# Patient Record
Sex: Female | Born: 1955 | Race: White | Hispanic: No | Marital: Married | Smoking: Current every day smoker
Health system: Southern US, Community
[De-identification: ages and names within clinical notes are randomized; demographics above are authoritative.]

## PROBLEM LIST (undated history)

## (undated) DIAGNOSIS — I10 Essential (primary) hypertension: Secondary | ICD-10-CM

## (undated) DIAGNOSIS — J449 Chronic obstructive pulmonary disease, unspecified: Secondary | ICD-10-CM

## (undated) DIAGNOSIS — E7800 Pure hypercholesterolemia, unspecified: Secondary | ICD-10-CM

## (undated) HISTORY — DX: Chronic obstructive pulmonary disease, unspecified: J44.9

---

## 2006-07-31 ENCOUNTER — Other Ambulatory Visit: Admission: RE | Admit: 2006-07-31 | Discharge: 2006-07-31 | Payer: Self-pay | Admitting: Gynecology

## 2006-08-21 ENCOUNTER — Encounter: Admission: RE | Admit: 2006-08-21 | Discharge: 2006-08-21 | Payer: Self-pay | Admitting: Gynecology

## 2014-06-05 ENCOUNTER — Encounter (HOSPITAL_COMMUNITY): Payer: Self-pay | Admitting: Emergency Medicine

## 2014-06-05 ENCOUNTER — Emergency Department (HOSPITAL_COMMUNITY)
Admission: EM | Admit: 2014-06-05 | Discharge: 2014-06-05 | Disposition: A | Discharge status: Home / Self Care | Payer: No Typology Code available for payment source | Source: Home / Self Care | Attending: Family Medicine | Admitting: Family Medicine

## 2014-06-05 DIAGNOSIS — J9801 Acute bronchospasm: Secondary | ICD-10-CM

## 2014-06-05 DIAGNOSIS — Z720 Tobacco use: Secondary | ICD-10-CM

## 2014-06-05 DIAGNOSIS — J069 Acute upper respiratory infection, unspecified: Secondary | ICD-10-CM

## 2014-06-05 MED ORDER — PREDNISONE 20 MG PO TABS: ORAL_TABLET | ORAL | Status: DC

## 2014-06-05 MED ORDER — TRIAMCINOLONE ACETONIDE 40 MG/ML IJ SUSP
40.0000 mg | Freq: Once | INTRAMUSCULAR | Status: AC
  Administered 2014-06-05: 40 mg via INTRAMUSCULAR

## 2014-06-05 MED ORDER — ALBUTEROL SULFATE (2.5 MG/3ML) 0.083% IN NEBU
INHALATION_SOLUTION | RESPIRATORY_TRACT | Status: AC
  Filled 2014-06-05: qty 3

## 2014-06-05 MED ORDER — ALBUTEROL SULFATE HFA 108 (90 BASE) MCG/ACT IN AERS: 2.0000 | INHALATION_SPRAY | RESPIRATORY_TRACT | Status: DC | PRN

## 2014-06-05 MED ORDER — TRIAMCINOLONE ACETONIDE 40 MG/ML IJ SUSP
INTRAMUSCULAR | Status: AC
  Filled 2014-06-05: qty 1

## 2014-06-05 MED ORDER — ALBUTEROL SULFATE (2.5 MG/3ML) 0.083% IN NEBU
2.5000 mg | INHALATION_SOLUTION | Freq: Once | RESPIRATORY_TRACT | Status: AC
  Administered 2014-06-05: 2.5 mg via RESPIRATORY_TRACT

## 2014-06-05 MED ORDER — IPRATROPIUM-ALBUTEROL 0.5-2.5 (3) MG/3ML IN SOLN
3.0000 mL | Freq: Once | RESPIRATORY_TRACT | Status: AC
  Administered 2014-06-05: 3 mL via RESPIRATORY_TRACT

## 2014-06-05 MED ORDER — IPRATROPIUM-ALBUTEROL 0.5-2.5 (3) MG/3ML IN SOLN
RESPIRATORY_TRACT | Status: AC
  Filled 2014-06-05: qty 3

## 2014-06-05 NOTE — Discharge Instructions (Signed)
Bronchospasm A bronchospasm is a spasm or tightening of the airways going into the lungs. During a bronchospasm breathing becomes more difficult because the airways get smaller. When this happens there can be coughing, a whistling sound when breathing (wheezing), and difficulty breathing. Bronchospasm is often associated with asthma, but not all patients who experience a bronchospasm have asthma. CAUSES  A bronchospasm is caused by inflammation or irritation of the airways. The inflammation or irritation may be triggered by:   Allergies (such as to animals, pollen, food, or mold). Allergens that cause bronchospasm may cause wheezing immediately after exposure or many hours later.   Infection. Viral infections are believed to be the most common cause of bronchospasm.   Exercise.   Irritants (such as pollution, cigarette smoke, strong odors, aerosol sprays, and paint fumes).   Weather changes. Winds increase molds and pollens in the air. Rain refreshes the air by washing irritants out. Cold air may cause inflammation.   Stress and emotional upset.  SIGNS AND SYMPTOMS   Wheezing.   Excessive nighttime coughing.   Frequent or severe coughing with a simple cold.   Chest tightness.   Shortness of breath.  DIAGNOSIS  Bronchospasm is usually diagnosed through a history and physical exam. Tests, such as chest X-rays, are sometimes done to look for other conditions. TREATMENT   Inhaled medicines can be given to open up your airways and help you breathe. The medicines can be given using either an inhaler or a nebulizer machine.  Corticosteroid medicines may be given for severe bronchospasm, usually when it is associated with asthma. HOME CARE INSTRUCTIONS   Always have a plan prepared for seeking medical care. Know when to call your health care provider and local emergency services (911 in the [place] Know where you can access local emergency care.  Only take medicines as  directed by your health care provider.  If you were prescribed an inhaler or nebulizer machine, ask your health care provider to explain how to use it correctly. Always use a spacer with your inhaler if you were given one.  It is necessary to remain calm during an attack. Try to relax and breathe more slowly.  Control your home environment in the following ways:   Change your heating and air conditioning filter at least once a month.   Limit your use of fireplaces and wood stoves.  Do not smoke and do not allow smoking in your home.   Avoid exposure to perfumes and fragrances.   Get rid of pests (such as roaches and mice) and their droppings.   Throw away plants if you see mold on them.   Keep your house clean and dust free.   Replace carpet with wood, tile, or vinyl flooring. Carpet can trap dander and dust.   Use allergy-proof pillows, mattress covers, and box spring covers.   Wash bed sheets and blankets every week in hot water and dry them in a dryer.   Use blankets that are made of polyester or cotton.   Wash hands frequently. SEEK MEDICAL CARE IF:   You have muscle aches.   You have chest pain.   The sputum changes from clear or white to yellow, green, gray, or bloody.   The sputum you cough up gets thicker.   There are problems that may be related to the medicine you are given, such as a rash, itching, swelling, or trouble breathing.  SEEK IMMEDIATE MEDICAL CARE IF:   You have worsening wheezing and coughing even  after taking your prescribed medicines.   You have increased difficulty breathing.   You develop severe chest pain. MAKE SURE YOU:   Understand these instructions.  Will watch your condition.  Will get help right away if you are not doing well or get worse. Document Released: 07/14/2003 Document Revised: 07/16/2013 Document Reviewed: 12/31/2012 [facility] Patient Information 2015 [place], [place]. This information is not  intended to replace advice given to you by your health care provider. Make sure you discuss any questions you have with your health care provider.  Cough, Adult  A cough is a reflex that helps clear your throat and airways. It can help heal the body or may be a reaction to an irritated airway. A cough may only last 2 or 3 weeks (acute) or may last more than 8 weeks (chronic).  CAUSES Acute cough:  Viral or bacterial infections. Chronic cough:  Infections.  Allergies.  Asthma.  Post-nasal drip.  Smoking.  Heartburn or acid reflux.  Some medicines.  Chronic lung problems (COPD).  Cancer. SYMPTOMS   Cough.  Fever.  Chest pain.  Increased breathing rate.  High-pitched whistling sound when breathing (wheezing).  Colored mucus that you cough up (sputum). TREATMENT   A bacterial cough may be treated with antibiotic medicine.  A viral cough must run its course and will not respond to antibiotics.  Your caregiver may recommend other treatments if you have a chronic cough. HOME CARE INSTRUCTIONS   Only take over-the-counter or prescription medicines for pain, discomfort, or fever as directed by your caregiver. Use cough suppressants only as directed by your caregiver.  Use a cold steam vaporizer or humidifier in your bedroom or home to help loosen secretions.  Sleep in a semi-upright position if your cough is worse at night.  Rest as needed.  Stop smoking if you smoke. SEEK IMMEDIATE MEDICAL CARE IF:   You have pus in your sputum.  Your cough starts to worsen.  You cannot control your cough with suppressants and are losing sleep.  You begin coughing up blood.  You have difficulty breathing.  You develop pain which is getting worse or is uncontrolled with medicine.  You have a fever. MAKE SURE YOU:   Understand these instructions.  Will watch your condition.  Will get help right away if you are not doing well or get worse. Document Released:  01/07/2011 Document Revised: 10/03/2011 Document Reviewed: 01/07/2011 [facility] Patient Information 2015 [place], [place]. This information is not intended to replace advice given to you by your health care provider. Make sure you discuss any questions you have with your health care provider.  How to Use an Inhaler Using your inhaler correctly is very important. Good technique will make sure that the medicine reaches your lungs.  HOW TO USE AN INHALER:  Take the cap off the inhaler.  If this is the first time using your inhaler, you need to prime it. Shake the inhaler for 5 seconds. Release four puffs into the air, away from your face. Ask your doctor for help if you have questions.  Shake the inhaler for 5 seconds.  Turn the inhaler so the bottle is above the mouthpiece.  Put your pointer finger on top of the bottle. Your thumb holds the bottom of the inhaler.  Open your mouth.  Either hold the inhaler away from your mouth (the width of 2 fingers) or place your lips tightly around the mouthpiece. Ask your doctor which way to use your inhaler.  Breathe out as  much air as possible.  Breathe in and push down on the bottle 1 time to release the medicine. You will feel the medicine go in your mouth and throat.  Continue to take a deep breath in very slowly. Try to fill your lungs.  After you have breathed in completely, hold your breath for 10 seconds. This will help the medicine to settle in your lungs. If you cannot hold your breath for 10 seconds, hold it for as long as you can before you breathe out.  Breathe out slowly, through pursed lips. Whistling is an example of pursed lips.  If your doctor has told you to take more than 1 puff, wait at least 15-30 seconds between puffs. This will help you get the best results from your medicine. Do not use the inhaler more than your doctor tells you to.  Put the cap back on the inhaler.  Follow the directions from your doctor or from the  inhaler package about cleaning the inhaler. If you use more than one inhaler, ask your doctor which inhalers to use and what order to use them in. Ask your doctor to help you figure out when you will need to refill your inhaler.  If you use a steroid inhaler, always rinse your mouth with water after your last puff, gargle and spit out the water. Do not swallow the water. GET HELP IF:  The inhaler medicine only partially helps to stop wheezing or shortness of breath.  You are having trouble using your inhaler.  You have some increase in thick spit (phlegm). GET HELP RIGHT AWAY IF:  The inhaler medicine does not help your wheezing or shortness of breath or you have tightness in your chest.  You have dizziness, headaches, or fast heart rate.  You have chills, fever, or night sweats.  You have a large increase of thick spit, or your thick spit is bloody. MAKE SURE YOU:   Understand these instructions.  Will watch your condition.  Will get help right away if you are not doing well or get worse. Document Released: 04/19/2008 Document Revised: 05/01/2013 Document Reviewed: 02/07/2013 [facility] Patient Information 2015 [place], [place]. This information is not intended to replace advice given to you by your health care provider. Make sure you discuss any questions you have with your health care provider.  Smoking Cessation Quitting smoking is important to your health and has many advantages. However, it is not always easy to quit since nicotine is a very addictive drug. Oftentimes, people try 3 times or more before being able to quit. This document explains the best ways for you to prepare to quit smoking. Quitting takes hard work and a lot of effort, but you can do it. ADVANTAGES OF QUITTING SMOKING  You will live longer, feel better, and live better.  Your body will feel the impact of quitting smoking almost immediately.  Within 20 minutes, blood pressure decreases. Your pulse returns to  its normal level.  After 8 hours, carbon monoxide levels in the blood return to normal. Your oxygen level increases.  After 24 hours, the chance of having a heart attack starts to decrease. Your breath, hair, and body stop smelling like smoke.  After 48 hours, damaged nerve endings begin to recover. Your sense of taste and smell improve.  After 72 hours, the body is virtually free of nicotine. Your bronchial tubes relax and breathing becomes easier.  After 2 to 12 weeks, lungs can hold more air. Exercise becomes easier and circulation  improves.  The risk of having a heart attack, stroke, cancer, or lung disease is greatly reduced.  After 1 year, the risk of coronary heart disease is cut in half.  After 5 years, the risk of stroke falls to the same as a nonsmoker.  After 10 years, the risk of lung cancer is cut in half and the risk of other cancers decreases significantly.  After 15 years, the risk of coronary heart disease drops, usually to the level of a nonsmoker.  If you are pregnant, quitting smoking will improve your chances of having a healthy baby.  The people you live with, especially any children, will be healthier.  You will have extra money to spend on things other than cigarettes. QUESTIONS TO THINK ABOUT BEFORE ATTEMPTING TO QUIT You may want to talk about your answers with your health care provider.  Why do you want to quit?  If you tried to quit in the past, what helped and what did not?  What will be the most difficult situations for you after you quit? How will you plan to handle them?  Who can help you through the tough times? Your family? Friends? A health care provider?  What pleasures do you get from smoking? What ways can you still get pleasure if you quit? Here are some questions to ask your health care provider:  How can you help me to be successful at quitting?  What medicine do you think would be best for me and how should I take it?  What should  I do if I need more help?  What is smoking withdrawal like? How can I get information on withdrawal? GET READY  Set a quit date.  Change your environment by getting rid of all cigarettes, ashtrays, matches, and lighters in your home, car, or work. Do not let people smoke in your home.  Review your past attempts to quit. Think about what worked and what did not. GET SUPPORT AND ENCOURAGEMENT You have a better chance of being successful if you have help. You can get support in many ways.  Tell your family, friends, and coworkers that you are going to quit and need their support. Ask them not to smoke around you.  Get individual, group, or telephone counseling and support. Programs are available at [facility]. Call your local health department for information about programs in your area.  Spiritual beliefs and practices may help some smokers quit.  Download a "quit meter" on your computer to keep track of quit statistics, such as how long you have gone without smoking, cigarettes not smoked, and money saved.  Get a self-help book about quitting smoking and staying off tobacco. LEARN NEW SKILLS AND BEHAVIORS  Distract yourself from urges to smoke. Talk to someone, go for a walk, or occupy your time with a task.  Change your normal routine. Take a different route to work. Drink tea instead of coffee. Eat breakfast in a different place.  Reduce your stress. Take a hot bath, exercise, or read a book.  Plan something enjoyable to do every day. Reward yourself for not smoking.  Explore interactive web-based programs that specialize in helping you quit. GET MEDICINE AND USE IT CORRECTLY Medicines can help you stop smoking and decrease the urge to smoke. Combining medicine with the above behavioral methods and support can greatly increase your chances of successfully quitting smoking.  Nicotine replacement therapy helps deliver nicotine to your body without the  negative effects and  risks of smoking. Nicotine replacement therapy includes nicotine gum, lozenges, inhalers, nasal sprays, and skin patches. Some may be available over-the-counter and others require a prescription.  Antidepressant medicine helps people abstain from smoking, but how this works is unknown. This medicine is available by prescription.  Nicotinic receptor partial agonist medicine simulates the effect of nicotine in your brain. This medicine is available by prescription. Ask your health care provider for advice about which medicines to use and how to use them based on your health history. Your health care provider will tell you what side effects to look out for if you choose to be on a medicine or therapy. Carefully read the information on the package. Do not use any other product containing nicotine while using a nicotine replacement product.  RELAPSE OR DIFFICULT SITUATIONS Most relapses occur within the first 3 months after quitting. Do not be discouraged if you start smoking again. Remember, most people try several times before finally quitting. You may have symptoms of withdrawal because your body is used to nicotine. You may crave cigarettes, be irritable, feel very hungry, cough often, get headaches, or have difficulty concentrating. The withdrawal symptoms are only temporary. They are strongest when you first quit, but they will go away within 10-14 days. To reduce the chances of relapse, try to:  Avoid drinking alcohol. Drinking lowers your chances of successfully quitting.  Reduce the amount of caffeine you consume. Once you quit smoking, the amount of caffeine in your body increases and can give you symptoms, such as a rapid heartbeat, sweating, and anxiety.  Avoid smokers because they can make you want to smoke.  Do not let weight gain distract you. Many smokers will gain weight when they quit, usually less than 10 pounds. Eat a healthy diet and stay active. You can always  lose the weight gained after you quit.  Find ways to improve your mood other than smoking. FOR MORE INFORMATION  www.smokefree.gov  Document Released: 07/05/2001 Document Revised: 11/25/2013 Document Reviewed: 10/20/2011 [facility] Patient Information 2015 [place]. This information is not intended to replace advice given to you by your health care provider. Make sure you discuss any questions you have with your health care provider.  Upper Respiratory Infection, Adult An upper respiratory infection (URI) is also known as the common cold. It is often caused by a type of germ (virus). Colds are easily spread (contagious). You can pass it to others by kissing, coughing, sneezing, or drinking out of the same glass. Usually, you get better in 1 or 2 weeks.  HOME CARE   Only take medicine as told by your doctor.  Use a warm mist humidifier or breathe in steam from a hot shower.  Drink enough water and fluids to keep your pee (urine) clear or pale yellow.  Get plenty of rest.  Return to work when your temperature is back to normal or as told by your doctor. You may use a face mask and wash your hands to stop your cold from spreading. GET HELP RIGHT AWAY IF:   After the first few days, you feel you are getting worse.  You have questions about your medicine.  You have chills, shortness of breath, or brown or red spit (mucus).  You have yellow or brown snot (nasal discharge) or pain in the face, especially when you bend forward.  You have a fever, puffy (swollen) neck, pain when you swallow, or white spots in the back of your throat.  You have a bad  headache, ear pain, sinus pain, or chest pain.  You have a high-pitched whistling sound when you breathe in and out (wheezing).  You have a lasting cough or cough up blood.  You have sore muscles or a stiff neck. MAKE SURE YOU:   Understand these instructions.  Will watch your condition.  Will get help right away if you are not  doing well or get worse. Document Released: 12/28/2007 Document Revised: 10/03/2011 Document Reviewed: 10/16/2013 [facility] Patient Information 2015 [place], [place]. This information is not intended to replace advice given to you by your health care provider. Make sure you discuss any questions you have with your health care provider.

## 2014-06-05 NOTE — ED Notes (Signed)
Cough and cold for 2 weeks.  Has tried mucinex and antihistamine .  Initially sore throat, then head congestion, then chest congestion.  Reports blowing green and coughing up green in am, sputum clears in color throughout the day.  Reports cough is wearing her out, very tired

## 2014-06-05 NOTE — ED Provider Notes (Signed)
CSN: [id number]     Arrival date & time 06/05/14  0801 History   First MD Initiated Contact with Patient 06/05/14 [phone]     Chief Complaint  Patient presents with  . Cough  . URI   (Consider location/radiation/quality/duration/timing/severity/associated sxs/prior Treatment) HPI Comments: COugh for 1 week. Smokes 1/2PPD. PND. Denies fevers.   History reviewed. No pertinent past medical history. History reviewed. No pertinent past surgical history. No family history on file. History  Substance Use Topics  . Smoking status: Current Every Day Smoker  . Smokeless tobacco: Not on file  . Alcohol Use: No   OB History    No data available     Review of Systems  Constitutional: Negative for fever, diaphoresis, activity change and fatigue.  HENT: Positive for congestion and postnasal drip.   Eyes: Negative.   Respiratory: Positive for cough and shortness of breath.   Cardiovascular: Negative.   Gastrointestinal: Negative.   Neurological: Negative.     Allergies  Review of patient's allergies indicates no known allergies.  Home Medications   Prior to Admission medications   Medication Sig Start Date End Date Taking? Authorizing Provider  guaiFENesin (MUCINEX) 600 MG 12 hr tablet Take by mouth 2 (two) times daily.   Yes Historical Provider, MD  albuterol (PROVENTIL HFA;VENTOLIN HFA) 108 (90 BASE) MCG/ACT inhaler Inhale 2 puffs into the lungs every 4 (four) hours as needed for wheezing or shortness of breath. 06/05/14   Hayden Rasmussenavid Abdulhadi Stopa, NP  predniSONE (DELTASONE) 20 MG tablet Take 3 tabs po on first day, 2 tabs second day, 2 tabs third day, 1 tab fourth day, 1 tab 5th day. Take with food. Start 06/06/14. 06/05/14   Hayden Rasmussenavid Exzavier Ruderman, NP   BP 139/80 mmHg  Pulse 73  Temp(Src) 98.2 F (36.8 C) (Oral)  Resp 18  SpO2 97% Physical Exam  Constitutional: She is oriented to person, place, and time. She appears well-developed and well-nourished. No distress.  HENT:  Mouth/Throat: Oropharynx is  clear and moist. No oropharyngeal exudate.  OP with moderate clear PND  Eyes: Conjunctivae and EOM are normal.  Neck: Normal range of motion. Neck supple.  Cardiovascular: Normal rate, regular rhythm and normal heart sounds.   Pulmonary/Chest: Effort normal. No respiratory distress. She has wheezes. She has no rales.  Prolonged expiratory phase  Lymphadenopathy:    She has no cervical adenopathy.  Neurological: She is alert and oriented to person, place, and time.  Skin: Skin is warm and dry.  Psychiatric: She has a normal mood and affect.  Nursing note and vitals reviewed.   ED Course  Procedures (including critical care time) Labs Review Labs Reviewed - No data to display  Imaging Review No results found.   MDM   1. Bronchospasm   2. URI (upper respiratory infection)   3. Tobacco abuse    \  Post Duoneb 5/2.5: Improved air movement, decrease wheeze. Kenalog 40 mg IM Prednisone taper Albuterol hfa Continue Guiaf/DM F/u with a PCP for physical and lung evaluation.   Hayden Rasmussenavid Sallee Hogrefe, NP 06/05/14 [phone]

## 2016-01-18 ENCOUNTER — Other Ambulatory Visit: Payer: Self-pay | Admitting: Family

## 2016-01-18 ENCOUNTER — Other Ambulatory Visit: Payer: Self-pay

## 2016-01-18 ENCOUNTER — Other Ambulatory Visit (HOSPITAL_COMMUNITY)
Admission: RE | Admit: 2016-01-18 | Discharge: 2016-01-18 | Disposition: A | Discharge status: Home / Self Care | Payer: Managed Care, Other (non HMO) | Source: Ambulatory Visit | Attending: Family Medicine | Admitting: Family Medicine

## 2016-01-18 DIAGNOSIS — Z01419 Encounter for gynecological examination (general) (routine) without abnormal findings: Secondary | ICD-10-CM | POA: Insufficient documentation

## 2016-01-18 DIAGNOSIS — R5381 Other malaise: Secondary | ICD-10-CM

## 2016-01-19 ENCOUNTER — Other Ambulatory Visit: Payer: Self-pay | Admitting: Family

## 2016-01-19 DIAGNOSIS — Z1231 Encounter for screening mammogram for malignant neoplasm of breast: Secondary | ICD-10-CM

## 2016-01-19 LAB — CYTOLOGY - PAP

## 2016-07-22 ENCOUNTER — Other Ambulatory Visit: Payer: Self-pay | Admitting: Family

## 2016-07-22 DIAGNOSIS — E2839 Other primary ovarian failure: Secondary | ICD-10-CM

## 2016-07-26 ENCOUNTER — Other Ambulatory Visit: Payer: Self-pay | Admitting: Family

## 2016-07-26 DIAGNOSIS — Z1239 Encounter for other screening for malignant neoplasm of breast: Secondary | ICD-10-CM

## 2017-04-16 ENCOUNTER — Emergency Department (HOSPITAL_COMMUNITY)
Admission: EM | Admit: 2017-04-16 | Discharge: 2017-04-16 | Disposition: A | Discharge status: Home / Self Care | Payer: No Typology Code available for payment source | Attending: Emergency Medicine | Admitting: Emergency Medicine

## 2017-04-16 ENCOUNTER — Encounter (HOSPITAL_COMMUNITY): Payer: Self-pay | Admitting: Emergency Medicine

## 2017-04-16 ENCOUNTER — Emergency Department (HOSPITAL_COMMUNITY): Payer: No Typology Code available for payment source

## 2017-04-16 DIAGNOSIS — F17210 Nicotine dependence, cigarettes, uncomplicated: Secondary | ICD-10-CM | POA: Insufficient documentation

## 2017-04-16 DIAGNOSIS — K529 Noninfective gastroenteritis and colitis, unspecified: Secondary | ICD-10-CM | POA: Insufficient documentation

## 2017-04-16 LAB — COMPREHENSIVE METABOLIC PANEL
ALT: 29 U/L (ref 14–54)
AST: 30 U/L (ref 15–41)
Albumin: 3.4 g/dL — ABNORMAL LOW (ref 3.5–5.0)
Alkaline Phosphatase: 154 U/L — ABNORMAL HIGH (ref 38–126)
Anion gap: 9 (ref 5–15)
BUN: 12 mg/dL (ref 6–20)
CO2: 26 mmol/L (ref 22–32)
Calcium: 9.1 mg/dL (ref 8.9–10.3)
Chloride: 100 mmol/L — ABNORMAL LOW (ref 101–111)
Creatinine, Ser: 1.1 mg/dL — ABNORMAL HIGH (ref 0.44–1.00)
GFR calc Af Amer: >60 mL/min (ref >60)
GFR calc non Af Amer: 54 mL/min — ABNORMAL LOW (ref >60)
Glucose, Bld: 143 mg/dL — ABNORMAL HIGH (ref 65–99)
Potassium: 3.3 mmol/L — ABNORMAL LOW (ref 3.5–5.1)
Sodium: 135 mmol/L (ref 135–145)
Total Bilirubin: 0.6 mg/dL (ref 0.3–1.2)
Total Protein: 7.4 g/dL (ref 6.5–8.1)

## 2017-04-16 LAB — CBC
HCT: 38.3 % (ref 36.0–46.0)
Hemoglobin: 13 g/dL (ref 12.0–15.0)
MCH: 32.8 pg (ref 26.0–34.0)
MCHC: 33.9 g/dL (ref 30.0–36.0)
MCV: 96.7 fL (ref 78.0–100.0)
Platelets: 274 10*3/uL (ref 150–400)
RBC: 3.96 MIL/uL (ref 3.87–5.11)
RDW: 12.8 % (ref 11.5–15.5)
WBC: 11.1 10*3/uL — ABNORMAL HIGH (ref 4.0–10.5)

## 2017-04-16 LAB — URINALYSIS, ROUTINE W REFLEX MICROSCOPIC
Bilirubin Urine: NEGATIVE
Glucose, UA: NEGATIVE mg/dL
Ketones, ur: 20 mg/dL — AB
Leukocytes, UA: NEGATIVE
Nitrite: NEGATIVE
Protein, ur: 100 mg/dL — AB
Specific Gravity, Urine: 1.027 (ref 1.005–1.030)
pH: 5 (ref 5.0–8.0)

## 2017-04-16 LAB — I-STAT BETA HCG BLOOD, ED (MC, WL, AP ONLY): HCG, QUANTITATIVE: 5.3 m[IU]/mL — AB (ref <5)

## 2017-04-16 LAB — LIPASE, BLOOD: Lipase: 21 U/L (ref 11–51)

## 2017-04-16 MED ORDER — IOPAMIDOL (ISOVUE-300) INJECTION 61%
INTRAVENOUS | Status: AC
  Administered 2017-04-16: 100 mL
  Filled 2017-04-16: qty 100

## 2017-04-16 MED ORDER — HYDROMORPHONE HCL 1 MG/ML IJ SOLN
1.0000 mg | Freq: Once | INTRAMUSCULAR | Status: AC
  Administered 2017-04-16: 1 mg via INTRAVENOUS
  Filled 2017-04-16: qty 1

## 2017-04-16 MED ORDER — ONDANSETRON 4 MG PO TBDP
4.0000 mg | ORAL_TABLET | Freq: Once | ORAL | Status: AC | PRN
  Administered 2017-04-16: 4 mg via ORAL

## 2017-04-16 MED ORDER — SODIUM CHLORIDE 0.9 % IV BOLUS (SEPSIS)
1000.0000 mL | Freq: Once | INTRAVENOUS | Status: AC
  Administered 2017-04-16: 1000 mL via INTRAVENOUS

## 2017-04-16 MED ORDER — KETOROLAC TROMETHAMINE 15 MG/ML IJ SOLN
15.0000 mg | Freq: Once | INTRAMUSCULAR | Status: AC
  Administered 2017-04-16: 15 mg via INTRAVENOUS
  Filled 2017-04-16: qty 1

## 2017-04-16 MED ORDER — ONDANSETRON 4 MG PO TBDP
ORAL_TABLET | ORAL | Status: AC
  Filled 2017-04-16: qty 1

## 2017-04-16 MED ORDER — HYDROCODONE-ACETAMINOPHEN 5-325 MG PO TABS: 1.0000 | ORAL_TABLET | ORAL | 0 refills | Status: DC | PRN

## 2017-04-16 MED ORDER — IBUPROFEN 600 MG PO TABS: 600.0000 mg | ORAL_TABLET | Freq: Four times a day (QID) | ORAL | 0 refills | Status: DC | PRN

## 2017-04-16 NOTE — ED Notes (Signed)
Pt is resting comfortably-denies abd pain, but reports mild nausea.

## 2017-04-16 NOTE — ED Notes (Signed)
Unable to provide enough urine for POC urinalysis, iSTAT hcg order placed as an alternative.

## 2017-04-16 NOTE — ED Triage Notes (Signed)
Pt reports fever TMAX 102.5 since Wednesday, states she was already seen by PCP and told she has a virus after negative blood and urine exams. States loose stools that have since resolved. Now reporting vomiting x3 hours. States lower abdominal pain described as "my insides will fall out." Denies medical hx.

## 2017-04-16 NOTE — ED Notes (Signed)
Patient transported to CT 

## 2017-04-21 NOTE — ED Provider Notes (Signed)
[facility]-EM[facility]ENCY DEPT Provider Note   CSN: [id number] Arrival date & time: 04/16/17  4098     History   Chief Complaint Chief Complaint  Patient presents with  . Fever  . Emesis    HPI Anne Montes is a 61 y.o. female.  HPI   61yf with abdominal pain n/v and loose stool. Onset several days ago. Persistent. Pain waxes and wanes. NBNB vomiting. No blood in stool. Subjective fever. No sick contacts. No recent abx.   History reviewed. No pertinent past medical history.  There are no active problems to display for this patient.   History reviewed. No pertinent surgical history.  OB History    No data available       Home Medications    Prior to Admission medications   Medication Sig Start Date End Date Taking? Authorizing Provider  albuterol (PROVENTIL HFA;VENTOLIN HFA) 108 (90 BASE) MCG/ACT inhaler Inhale 2 puffs into the lungs every 4 (four) hours as needed for wheezing or shortness of breath. 06/05/14  Yes Mabe, Onalee Hua, NP  guaiFENesin (MUCINEX) 600 MG 12 hr tablet Take 1,200 mg by mouth 2 (two) times daily as needed for cough.    Yes [provider]  HYDROcodone-acetaminophen (NORCO/VICODIN) 5-325 MG tablet Take 1-2 tablets by mouth every 4 (four) hours as needed. 04/16/17   Raeford Razor, MD  ibuprofen (ADVIL,MOTRIN) 600 MG tablet Take 1 tablet (600 mg total) by mouth every 6 (six) hours as needed. 04/16/17   Raeford Razor, MD    Family History No family history on file.  Social History Social History  Substance Use Topics  . Smoking status: Current Every Day Smoker    Types: Cigarettes  . Smokeless tobacco: Never Used  . Alcohol use Yes     Allergies   Patient has no known allergies.   Review of Systems Review of Systems  All systems reviewed and negative, other than as noted in HPI.  Physical Exam Updated Vital Signs BP 131/84 (BP Location: Right Arm)   Pulse 71   Temp 98.9 F (37.2 C) (Oral)   Resp 17   Ht  (1.575 m)   Wt  58.5 kg (129 lb)   SpO2 95%   BMI 23.59 kg/m   Physical Exam  Constitutional: She appears well-developed and well-nourished. No distress.  HENT:  Head: Normocephalic and atraumatic.  Eyes: Conjunctivae are normal. Right eye exhibits no discharge. Left eye exhibits no discharge.  Neck: Neck supple.  Cardiovascular: Normal rate, regular rhythm and normal heart sounds.  Exam reveals no gallop and no friction rub.   No murmur heard. Pulmonary/Chest: Effort normal and breath sounds normal. No respiratory distress.  Abdominal: Soft. She exhibits no distension. There is tenderness.  Diffuse tenderness w/o rebound or guarding  Musculoskeletal: She exhibits no edema or tenderness.  Neurological: She is alert.  Skin: Skin is warm and dry.  Psychiatric: She has a normal mood and affect. Her behavior is normal. Thought content normal.  Nursing note and vitals reviewed.    ED Treatments / Results  Labs (all labs ordered are listed, but only abnormal results are displayed) Labs Reviewed  COMPREHENSIVE METABOLIC PANEL - Abnormal; Notable for the following:       Result Value   Potassium 3.3 (*)    Chloride 100 (*)    Glucose, Bld 143 (*)    Creatinine, Ser 1.10 (*)    Albumin 3.4 (*)    Alkaline Phosphatase 154 (*)    GFR calc non Af  Amer 54 (*)    All other components within normal limits  CBC - Abnormal; Notable for the following:    WBC 11.1 (*)    All other components within normal limits  URINALYSIS, ROUTINE W REFLEX MICROSCOPIC - Abnormal; Notable for the following:    APPearance CLOUDY (*)    Hgb urine dipstick SMALL (*)    Ketones, ur 20 (*)    Protein, ur 100 (*)    Bacteria, UA RARE (*)    Squamous Epithelial / LPF 6-30 (*)    All other components within normal limits  I-STAT BETA HCG BLOOD, ED ([facility], [facility], [facility] ONLY) - Abnormal; Notable for the following:    I-stat hCG, quantitative 5.3 (*)    All other components within normal limits  LIPASE, BLOOD  I-STAT BETA HCG BLOOD,  ED ([facility], [facility], [facility] ONLY)    EKG  EKG Interpretation None       Radiology No results found.   Ct Abdomen Pelvis W Contrast  Result Date: 04/16/2017 CLINICAL DATA:  Left lower quadrant pain with fevers EXAM: CT ABDOMEN AND PELVIS WITH CONTRAST TECHNIQUE: Multidetector CT imaging of the abdomen and pelvis was performed using the standard protocol following bolus administration of intravenous contrast. CONTRAST:  100 mL ISOVUE-300 IOPAMIDOL (ISOVUE-300) INJECTION 61% COMPARISON:  None. FINDINGS: Lower chest: Mild emphysematous changes are noted. No focal infiltrate or sizable effusion is seen. Hepatobiliary: No focal liver abnormality is seen. No gallstones, gallbladder wall thickening, or biliary dilatation. Pancreas: Unremarkable. No pancreatic ductal dilatation or surrounding inflammatory changes. Spleen: Normal in size without focal abnormality. Adrenals/Urinary Tract: Kidneys demonstrate no obstructive changes. The adrenal glands are within normal limits. Right renal cyst is seen. The bladder is decompressed. Stomach/Bowel: Multiple dilated loops of small bowel are seen. Some wall thickening and edema is noted as well. A relatively smooth transition zone is noted in the mid abdomen best seen on image number 37 of series 6. No obstructing mass lesion is seen. The more distal small bowel is mildly inflamed but not significantly dilated. This extends to the level of the terminal ileum. No abscess is seen. No surgical history to suggest adhesions is noted. Vascular/Lymphatic: Aortic atherosclerosis. No enlarged abdominal or pelvic lymph nodes. Reproductive: The uterus and adnexae are within normal limits. Prominent left ovarian vein is noted. Other: No abdominal wall hernia or abnormality. No abdominopelvic ascites. Musculoskeletal: No acute or significant osseous findings. IMPRESSION: Dilated loops of distal jejunum and proximal ileum with diffuse inflammatory change. No evidence of fistulization is seen.  These changes likely represent a generalized enteritis. The possibility of inflammatory bowel disease cannot be totally excluded. No fistulization or abscess formation is seen. Electronically Signed   By: Alcide Clever M.D.   On: 04/16/2017 11:01   Procedures Procedures (including critical care time)  Medications Ordered in ED Medications  ondansetron (ZOFRAN-ODT) disintegrating tablet 4 mg (4 mg Oral Given 04/16/17 0355)  sodium chloride 0.9 % bolus 1,000 mL (0 mLs Intravenous Stopped 04/16/17 1029)  HYDROmorphone (DILAUDID) injection 1 mg (1 mg Intravenous Given 04/16/17 0946)  ketorolac (TORADOL) 15 MG/ML injection 15 mg (15 mg Intravenous Given 04/16/17 0947)  iopamidol (ISOVUE-300) 61 % injection (100 mLs  Contrast Given 04/16/17 1036)     Initial Impression / Assessment and Plan / ED Course  I have reviewed the triage vital signs and the nursing notes.  Pertinent labs & imaging results that were available during my care of the patient were reviewed by me and considered in  my medical decision making (see chart for details).     61yF with abdominal pain and n/v. CT with enteritis. Feeling better with symptomatic tx in ED. Continue symptomatic tx. It has been determined that no acute conditions requiring further emergency intervention are present at this time. The patient has been advised of the diagnosis and plan. I reviewed any labs and imaging including any potential incidental findings. We have discussed signs and symptoms that warrant return to the ED and they are listed in the discharge instructions.    Final Clinical Impressions(s) / ED Diagnoses   Final diagnoses:  Enteritis    New Prescriptions Discharge Medication List as of 04/16/2017  1:49 PM    START taking these medications   Details  HYDROcodone-acetaminophen (NORCO/VICODIN) 5-325 MG tablet Take 1-2 tablets by mouth every 4 (four) hours as needed., Starting Sun 04/16/2017, Print    ibuprofen (ADVIL,MOTRIN) 600 MG  tablet Take 1 tablet (600 mg total) by mouth every 6 (six) hours as needed., Starting Sun 04/16/2017, Print         Raeford Razor, MD 04/21/17 1437

## 2018-09-18 IMAGING — CT CT ABD-PELV W/ CM
2 of 5 series · 15 of 46 positions shown, 17 images · IV contrast (APPLIED)
Comparison: None.

CLINICAL DATA: Left lower quadrant pain with fevers

EXAM:
CT ABDOMEN AND PELVIS WITH CONTRAST
TECHNIQUE: Multidetector CT imaging of the abdomen and pelvis was performed
using the standard protocol following bolus administration of
intravenous contrast.
CONTRAST:  100 mL 5LFN65-5ZZ IOPAMIDOL (5LFN65-5ZZ) INJECTION 61%

[Series 3: abdomen 5.0 · axial · 0.73mm/px · z∈[+871,+1226]mm · 12 of 85 slices shown, 14 images]
[im 7/85  soft-tissue]
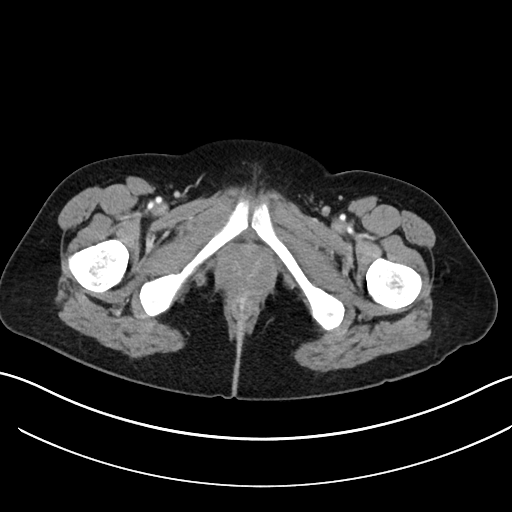
[im 7/85  bone]
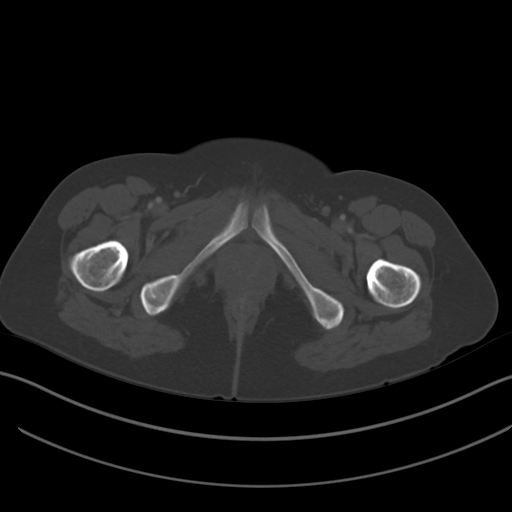
[im 13/85  soft-tissue]
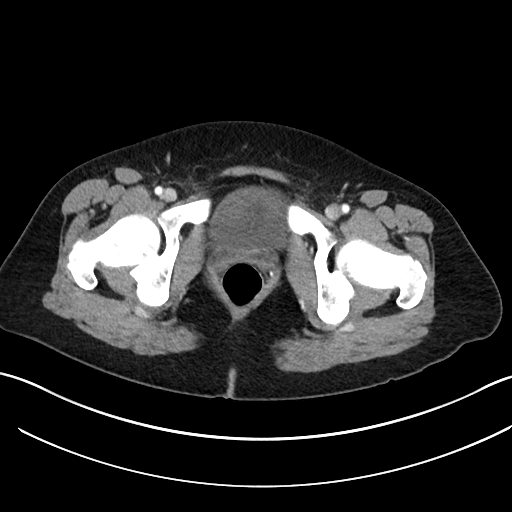
[im 20/85  soft-tissue]
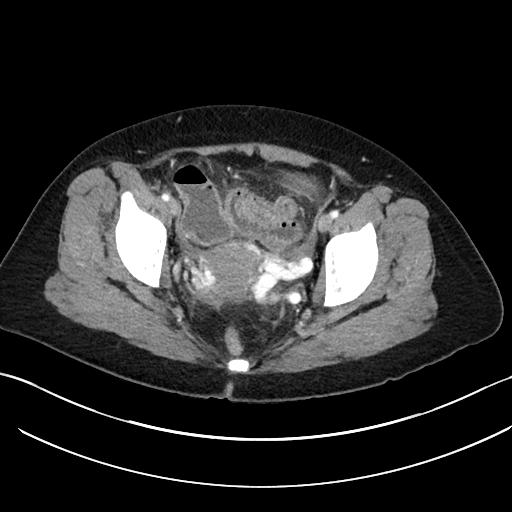
[im 26/85  soft-tissue]
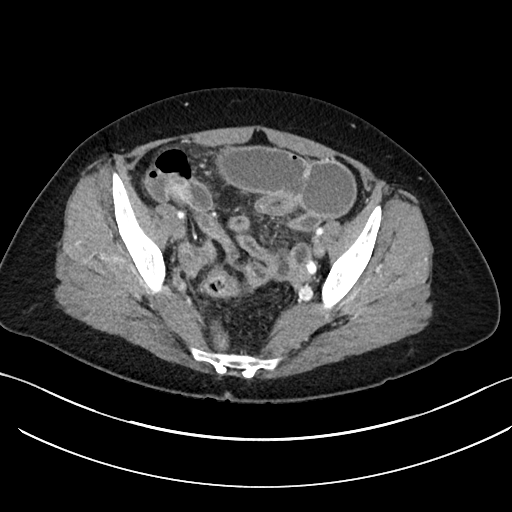
[im 33/85  soft-tissue]
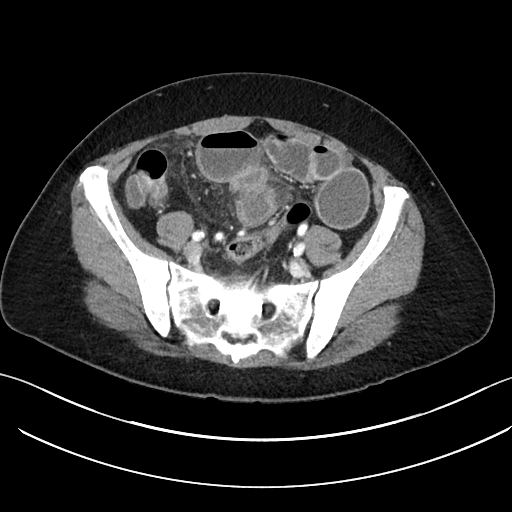
[im 39/85  soft-tissue]
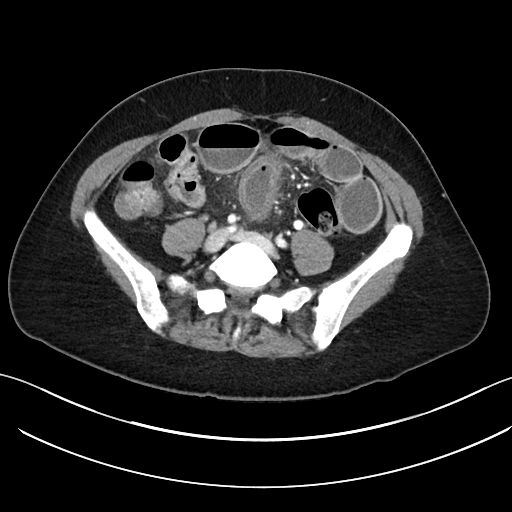
[im 46/85  soft-tissue]
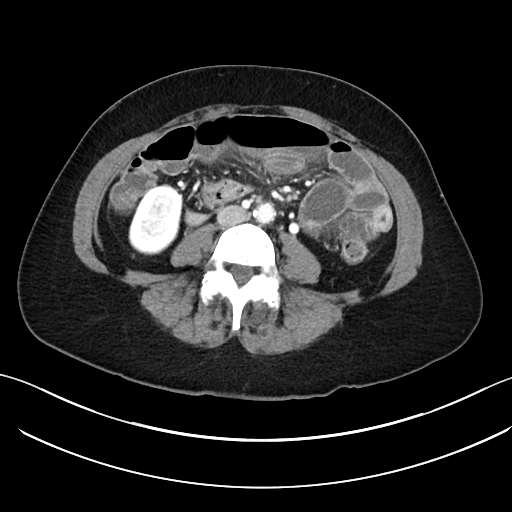
[im 52/85  soft-tissue]
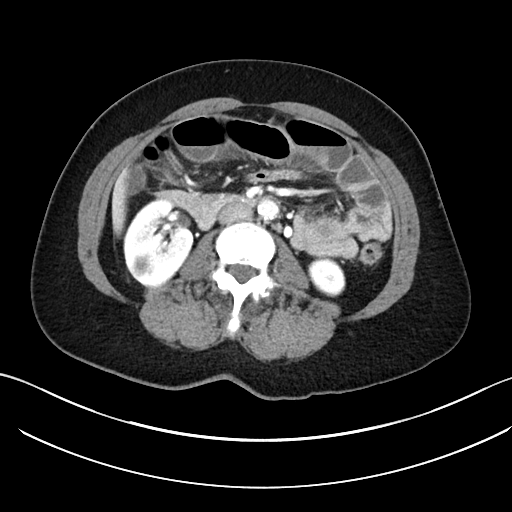
[im 59/85  soft-tissue]
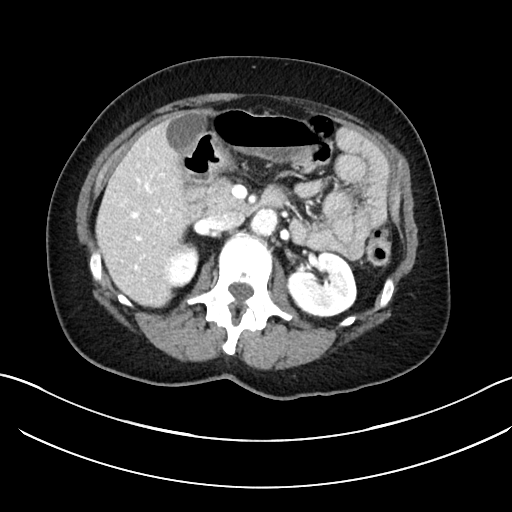
[im 59/85  bone]
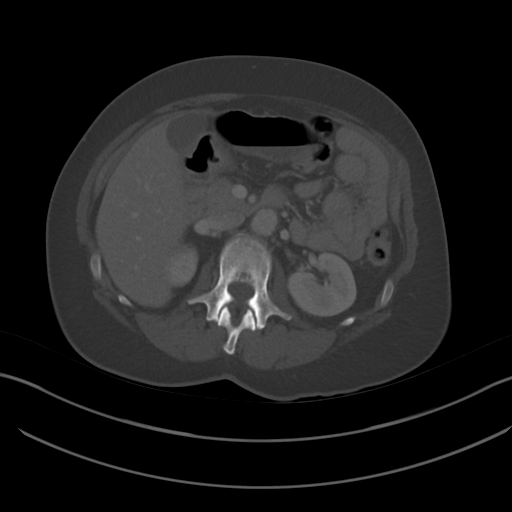
[im 65/85  soft-tissue]
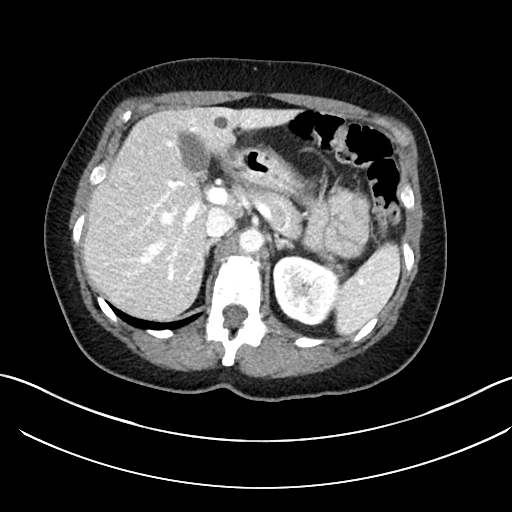
[im 72/85  soft-tissue]
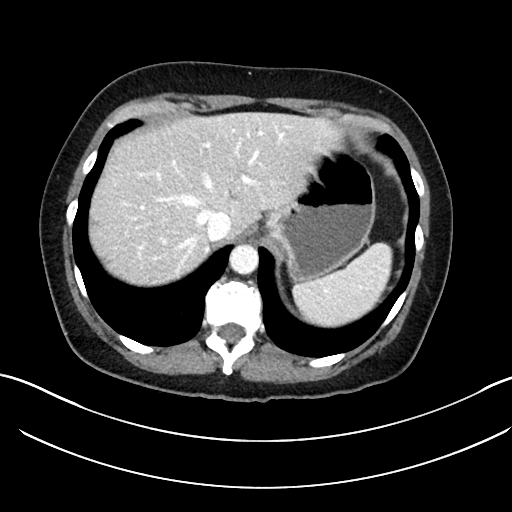
[im 78/85  soft-tissue]
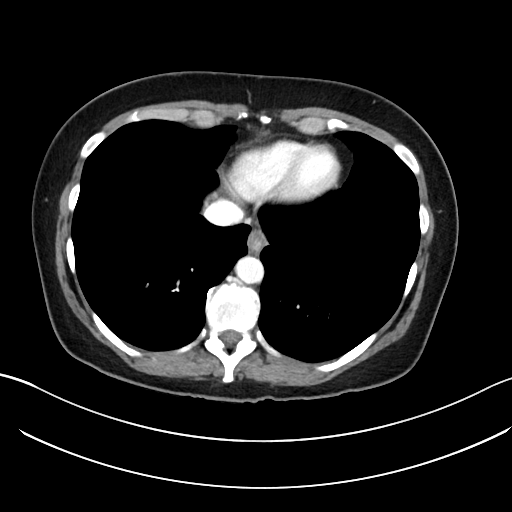

[Series 6: abdomen 3.0 mpr cor · coronal · 0.53mm/px · 3 of 72 slices shown]
[im 24/72  soft-tissue]
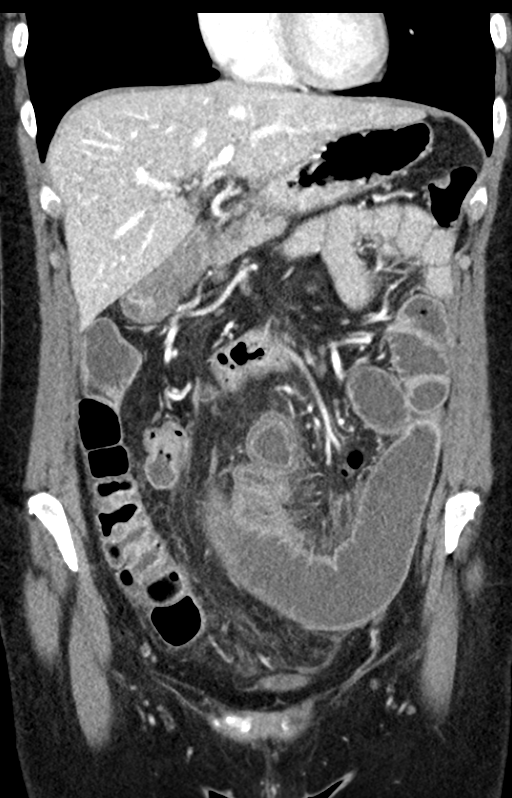
[im 32/72  soft-tissue]
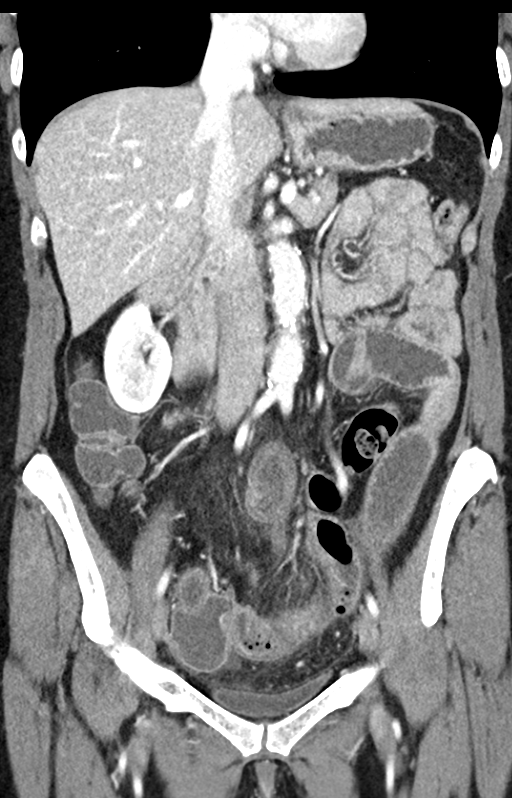
[im 40/72  soft-tissue]
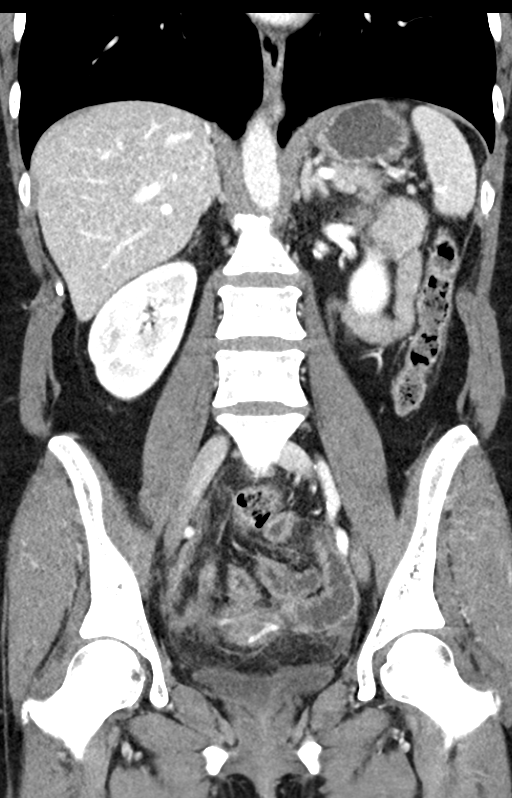

[15 of 46 positions shown; findings below may reference images not displayed]

FINDINGS: Lower chest: Mild emphysematous changes are noted. No focal
infiltrate or sizable effusion is seen.

Hepatobiliary: No focal liver abnormality is seen. No gallstones,
gallbladder wall thickening, or biliary dilatation.

Pancreas: Unremarkable. No pancreatic ductal dilatation or
surrounding inflammatory changes.

Spleen: Normal in size without focal abnormality.

Adrenals/Urinary Tract: Kidneys demonstrate no obstructive changes.
The adrenal glands are within normal limits. Right renal cyst is
seen. The bladder is decompressed.

Stomach/Bowel: Multiple dilated loops of small bowel are seen. Some
wall thickening and edema is noted as well. A relatively smooth
transition zone is noted in the mid abdomen best seen on image
number 37 of series 6. No obstructing mass lesion is seen. The more
distal small bowel is mildly inflamed but not significantly dilated.
This extends to the level of the terminal ileum. No abscess is seen.
No surgical history to suggest adhesions is noted.

Vascular/Lymphatic: Aortic atherosclerosis. No enlarged abdominal or
pelvic lymph nodes.

Reproductive: The uterus and adnexae are within normal limits.
Prominent left ovarian vein is noted.

Other: No abdominal wall hernia or abnormality. No abdominopelvic
ascites.

Musculoskeletal: No acute or significant osseous findings.
IMPRESSION: Dilated loops of distal jejunum and proximal ileum with diffuse
inflammatory change. No evidence of fistulization is seen. These
changes likely represent a generalized enteritis. The possibility of
inflammatory bowel disease cannot be totally excluded. No
fistulization or abscess formation is seen.

## 2019-06-25 ENCOUNTER — Other Ambulatory Visit: Payer: Self-pay

## 2019-06-25 DIAGNOSIS — Z20822 Contact with and (suspected) exposure to covid-19: Secondary | ICD-10-CM

## 2019-06-27 LAB — NOVEL CORONAVIRUS, NAA: SARS-CoV-2, NAA: NOT DETECTED

## 2019-11-01 ENCOUNTER — Ambulatory Visit: Payer: No Typology Code available for payment source | Attending: Internal Medicine

## 2019-11-01 DIAGNOSIS — Z23 Encounter for immunization: Secondary | ICD-10-CM

## 2019-11-01 NOTE — Progress Notes (Signed)
   Covid-19 Vaccination Clinic  Name:  Anne Montes    MRN: [id number] DOB: [birth year]  11/01/2019  [name] was observed post Covid-19 immunization for 15 minutes without incident. She was provided with Vaccine Information Sheet and instruction to access the V-Safe system.   [name] was instructed to call 911 with any severe reactions post vaccine: Marland Kitchen Difficulty breathing  . Swelling of face and throat  . A fast heartbeat  . A bad rash all over body  . Dizziness and weakness   Immunizations Administered    Name Date Dose VIS Date Route   Pfizer COVID-19 Vaccine 11/01/2019  3:03 PM 0.3 mL 07/05/2019 Intramuscular   Manufacturer: [employer], [employer]   Lot: [id number]   NDC: [address]-0

## 2019-11-26 ENCOUNTER — Ambulatory Visit: Payer: No Typology Code available for payment source | Attending: Internal Medicine

## 2019-11-26 DIAGNOSIS — Z23 Encounter for immunization: Secondary | ICD-10-CM

## 2019-11-26 NOTE — Progress Notes (Signed)
   Covid-19 Vaccination Clinic  Name:  Anne Montes    MRN: [id number] DOB: 1955/12/14  11/26/2019  [name] was observed post Covid-19 immunization for 15 minutes without incident. She was provided with Vaccine Information Sheet and instruction to access the V-Safe system.   [name] was instructed to call 911 with any severe reactions post vaccine: Marland Kitchen Difficulty breathing  . Swelling of face and throat  . A fast heartbeat  . A bad rash all over body  . Dizziness and weakness   Immunizations Administered    Name Date Dose VIS Date Route   Pfizer COVID-19 Vaccine 11/26/2019  2:12 PM 0.3 mL 09/18/2018 Intramuscular   Manufacturer: [employer], [employer]   Lot: [id number]   NDC: [address]-8

## 2020-10-29 ENCOUNTER — Telehealth: Payer: Self-pay | Admitting: *Deleted

## 2020-10-29 NOTE — Telephone Encounter (Signed)
Called [phone], spoke with Judeth Cornfield at the front desk briefly regarding need for referral information for patient, she transferred me to [facility].  I reached a vm, had to leave a vm requesting paperwork to be faxed to [place] regarding reason for consult and last office visit notes.  I also left office phone # and fax #.  Will await fax.

## 2020-10-30 ENCOUNTER — Ambulatory Visit (INDEPENDENT_AMBULATORY_CARE_PROVIDER_SITE_OTHER): Payer: 59 | Admitting: Internal Medicine

## 2020-10-30 ENCOUNTER — Other Ambulatory Visit: Payer: Self-pay

## 2020-10-30 ENCOUNTER — Encounter: Payer: Self-pay | Admitting: Internal Medicine

## 2020-10-30 VITALS — BP 142/70 | HR 86 | Temp 97.5°F | Ht 61.0 in | Wt 114.6 lb

## 2020-10-30 DIAGNOSIS — J449 Chronic obstructive pulmonary disease, unspecified: Secondary | ICD-10-CM

## 2020-10-30 MED ORDER — SPIRIVA RESPIMAT 2.5 MCG/ACT IN AERS: 2.0000 | INHALATION_SPRAY | Freq: Every day | RESPIRATORY_TRACT | 0 refills | Status: DC

## 2020-10-30 MED ORDER — ALBUTEROL SULFATE HFA 108 (90 BASE) MCG/ACT IN AERS: 2.0000 | INHALATION_SPRAY | Freq: Four times a day (QID) | RESPIRATORY_TRACT | 5 refills | Status: DC | PRN

## 2020-10-30 NOTE — Progress Notes (Signed)
Anne Montes    [id number]    08-05-1955  Primary Care Physician:Timberlake, Meridee Score, MD  Referring Physician: Shon Hale, MD [address] [place],  [place] [address] Reason for Consultation: COPD, lung cancer screening. Date of Consultation: 10/30/2020  Chief complaint:   Chief Complaint  Patient presents with  . Consult    Coughing in am and sob with really exerting herself.     HPI: Anne Montes is a 65 y.o. woman with ongoing tobacco use who presents for new patient evaluation of cough. Daily morning cough for the past 1.5 years productive with clear sputum.   PCP started on spiriva for a two week trial and it helped a lot.  She was also prescribed albuterol inhaler twice a day (previously was using more frequently.) Cough doesn't wake her up at night anymore since starting spiriva.   She started using primatene mist which might have helped her breathing.  She gets short of breath with exertion. She does have occasional wheezing which precedes the coughing. Breathing worse with weather changes and humid weather.   She does have recurrent bronchitis - one year 4 years ago she had it three times. Never been hospitalized for breathing. Feels better with prednisone.   No childhood respiratory disease, but she had recurrent ear infections and sore throats - was supposed to have her tonsils taken out.   She is trying to quit smoking. On wellbutrin for the past 3 weeks. Also using patches. Down to 4 cigarettes a day.   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 Has worked in [employer] previously and noticed  Exposures: lives at home with husband, no pets. Smoking history: she is a current everyday smoker 1.5 packs x 50 years  Social History   Occupational History  . Not on file  Tobacco Use  . Smoking status: Current Every Day Smoker    Packs/day: 1.50    Years: 50.00     Pack years: 75.00    Types: Cigarettes    Start date: 10/31/1970  . Smokeless tobacco: Never Used  . Tobacco comment: down to 3-4 cig/day  Substance and Sexual Activity  . Alcohol use: Yes  . Drug use: No  . Sexual activity: Not on file    Relevant family history:  Family History  Problem Relation Age of Onset  . Lung cancer Mother        died at 41  . Cancer Father        oral cancer  . Emphysema Maternal Grandmother   . Emphysema Paternal Grandmother     History reviewed. No pertinent past medical history.  History reviewed. No pertinent surgical history.   Physical Exam: Blood pressure (!) 142/70, pulse 86, temperature (!) 97.5 F (36.4 C), temperature source Temporal, height 5\' 1"  (1.549 m), weight 114 lb 9.6 oz (52 kg), SpO2 98 %. Gen:      No acute distress ENT:  no nasal polyps, mucus membranes moist Lungs:    Diminished bilaterally, No increased respiratory effort, symmetric chest wall excursion, clear to auscultation bilaterally, no wheezes or crackles CV:         Regular rate and rhythm; no murmurs, rubs, or gallops.  No pedal edema Abd:      + bowel sounds; soft, non-tender; no distension MSK: no acute synovitis of DIP or PIP joints, no mechanics hands.  Skin:  Warm and dry; no rashes Neuro: normal speech, no focal facial asymmetry Psych: alert and oriented x3, normal mood and affect   Data Reviewed/Medical Decision Making:  Independent interpretation of tests: Imaging: . Review of patient's CT A/P 2018 images revealed lung windows in the bases reveal emphysema. The patient's images have been independently reviewed by me.    PFTs: None on file  Labs: none to review  Immunization status:  Immunization History  Administered Date(s) Administered  . PFIZER(Purple Top)SARS-COV-2 Vaccination 11/01/2019, 11/26/2019, 07/12/2020  . Pneumococcal Polysaccharide-23 01/18/2016    . I reviewed prior external note(s) from primary are - Dr. Chanetta Marshall . I  reviewed the result(s) of the labs and imaging as noted above . I have ordered PFT    Assessment:  COPD - new diagnosis Tobacco use disorder  Plan/Recommendations: Will give samples of spiriva - she is going to call her insurance company to find out which inhaler is covered - otherwise copay is $200. Continue albuterol inhaler.   Will refer for lung cancer screening program.  Will obtain a full set of PFTs and see her after.    We discussed disease management and progression at length today regarding COPD.    Return to Care: Return in about 4 weeks (around 11/27/2020).  Durel Salts, MD Pulmonary and Critical Care Medicine [facility] Office:[phone]  CC: Shon Hale, *

## 2020-10-30 NOTE — Patient Instructions (Addendum)
The patient should have follow up scheduled with myself in 4 weeks.   Prior to next visit patient should have:  Full set of PFTs - one hour, appointment after, next available.  We will contact you with appointment for lung cancer screening  Call your insurance company and find out which inhalers are covered for COPD at a lower cost.  Tiotropium Umeclidinium Trelegy Breztri Anoro Stiolto  Understanding COPD   What is COPD? COPD stands for chronic obstructive pulmonary (lung) disease. COPD is a general term used for several lung diseases.  COPD is an umbrella term and encompasses other  common diseases in this group like chronic bronchitis and emphysema. Chronic asthma may also be included in this group. While some patients with COPD have only chronic bronchitis or emphysema, most patients have a combination of both.  You might hear these terms used in exchange for one another.   COPD adds to the work of the heart. Diseased lungs may reduce the amount of oxygen that goes to the blood. High blood pressure in blood vessels from the heart to the lungs makes it difficult for the heart to pump. Lung disease can also cause the body to produce too many red blood cells which may make the blood thicker and harder to pump.   Patients who have COPD with low oxygen levels may develop an enlarged heart (cor pulmonale). This condition weakens the heart and causes increased shortness of breath and swelling in the legs and feet.   Chronic bronchitis Chronic bronchitis is irritation and inflammation (swelling) of the lining in the bronchial tubes (air passages). The irritation causes coughing and an excess amount of mucus in the airways. The swelling makes it difficult to get air in and out of the lungs. The small, hair-like structures on the inside of the airways (called cilia) may be damaged by the irritation. The cilia are then unable to help clean mucus from the airways.  Bronchitis is generally  considered to be chronic when you have: a productive cough (cough up mucus) and shortness of breath that lasts about 3 months or more each year for 2 or more years in a row. Your doctor may define chronic bronchitis differently.   Emphysema Emphysema is the destruction, or breakdown, of the walls of the alveoli (air sacs) located at the end of the bronchial tubes. The damaged alveoli are not able to exchange oxygen and carbon dioxide between the lungs and the blood. The bronchioles lose their elasticity and collapse when you exhale, trapping air in the lungs. The trapped air keeps fresh air and oxygen from entering the lungs.   Who is affected by COPD? Emphysema and chronic bronchitis affect approximately 16 million people in the [place], or close to 11 percent of the population.   Symptoms of COPD   Shortness of breath   Shortness of breath with mild exercise (walking, using the stairs, etc.)   Chronic, productive cough (with mucus)   A feeling of "tightness" in the chest   Wheezing   What causes COPD? The two primary causes of COPD are cigarette smoking and alpha1-antitrypsin (AAT) deficiency. Air pollution and occupational dusts may also contribute to COPD, especially when the person exposed to these substances is a cigarette smoker.  Cigarette smoke causes COPD by irritating the airways and creating inflammation that narrows the airways, making it more difficult to breathe. Cigarette smoke also causes the cilia to stop working properly so mucus and trapped particles are not cleaned from  the airways. As a result, chronic cough and excess mucus production develop, leading to chronic bronchitis.  In some people, chronic bronchitis and infections can lead to destruction of the small airways, or emphysema.  AAT deficiency, an inherited disorder, can also lead to emphysema. Alpha antitrypsin (AAT) is a protective material produced in the liver and transported to the lungs to help combat  inflammation. When there is not enough of the chemical AAT, the body is no longer protected from an enzyme in the white blood cells.   How is COPD diagnosed?  To diagnose COPD, the physician needs to know: . Do you smoke?  . Have you had chronic exposure to dust or air pollutants?  . Do other members of your family have lung disease?  Marland Kitchen Are you short of breath?  . Do you get short of breath with exercise?  Marland Kitchen Do you have chronic cough and/or wheezing?  Marland Kitchen Do you cough up excess mucus?  To help with the diagnosis, the physician will conduct a thorough physical exam which includes:  1. Listening to your lungs and heart  2. Checking your blood pressure and pulse  3. Examining your nose and throat  4. Checking your feet and ankles for swelling   Laboratory and other tests Several laboratory and other tests are needed to confirm a diagnosis of COPD. These tests may include:  . Chest X-ray to look for lung changes that could be caused by COPD  .  Spirometry and pulmonary function tests (PFTs) to determine lung volume and air flow  . Pulse oximetry to measure the saturation of oxygen in the blood  . Arterial blood gases (ABGs) to determine the amount of oxygen and carbon dioxide in the blood  . Exercise testing to determine if the oxygen level in the blood drops during exercise   Treatment In the beginning stages of COPD, there is minimal shortness of breath that may be noticed only during exercise. As the disease progresses, shortness of breath may worsen and you may need to wear an oxygen device.   To help control other symptoms of COPD, the following treatments and lifestyle changes may be prescribed.  . Quitting smoking  . Avoiding cigarette smoke and other irritants  . Taking medications including: a. bronchodilators b. anti-inflammatory agents c. oxygen d. antibiotics  . Maintaining a healthy diet  . Following a structured exercise program such as pulmonary  rehabilitation . Preventing respiratory infections  . Controlling stress   If your COPD progresses, you may be eligible to be evaluated for lung volume reduction surgery or lung transplantation. You may also be eligible to participate in certain clinical trials (research studies). Ask your health care providers about studies being conducted in your hospital.   What is the outlook? Although COPD can not be cured, its symptoms can be treated and your quality of life can be improved. Your prognosis or outlook for the future will depend on how well your lungs are functioning, your symptoms, and how well you respond to and follow your treatment plan.

## 2020-11-23 ENCOUNTER — Other Ambulatory Visit: Payer: Self-pay | Admitting: *Deleted

## 2020-11-23 DIAGNOSIS — F17210 Nicotine dependence, cigarettes, uncomplicated: Secondary | ICD-10-CM

## 2020-12-17 ENCOUNTER — Other Ambulatory Visit: Payer: Self-pay | Admitting: Family Medicine

## 2020-12-17 ENCOUNTER — Other Ambulatory Visit (HOSPITAL_COMMUNITY)
Admission: RE | Admit: 2020-12-17 | Discharge: 2020-12-17 | Disposition: A | Discharge status: Home / Self Care | Payer: 59 | Source: Ambulatory Visit | Attending: Family Medicine | Admitting: Family Medicine

## 2020-12-17 DIAGNOSIS — Z01411 Encounter for gynecological examination (general) (routine) with abnormal findings: Secondary | ICD-10-CM | POA: Diagnosis not present

## 2020-12-18 ENCOUNTER — Other Ambulatory Visit (HOSPITAL_COMMUNITY)
Admission: RE | Admit: 2020-12-18 | Discharge: 2020-12-18 | Disposition: A | Discharge status: Home / Self Care | Payer: 59 | Source: Ambulatory Visit | Attending: Internal Medicine | Admitting: Internal Medicine

## 2020-12-18 DIAGNOSIS — Z01812 Encounter for preprocedural laboratory examination: Secondary | ICD-10-CM | POA: Insufficient documentation

## 2020-12-18 DIAGNOSIS — Z20822 Contact with and (suspected) exposure to covid-19: Secondary | ICD-10-CM | POA: Insufficient documentation

## 2020-12-19 LAB — SARS CORONAVIRUS 2 (TAT 6-24 HRS): SARS Coronavirus 2: NEGATIVE

## 2020-12-22 ENCOUNTER — Ambulatory Visit (INDEPENDENT_AMBULATORY_CARE_PROVIDER_SITE_OTHER): Payer: 59 | Admitting: Internal Medicine

## 2020-12-22 ENCOUNTER — Encounter: Payer: Self-pay | Admitting: Internal Medicine

## 2020-12-22 ENCOUNTER — Other Ambulatory Visit: Payer: Self-pay

## 2020-12-22 VITALS — BP 150/88 | HR 71 | Temp 98.5°F | Ht 60.0 in | Wt 116.0 lb

## 2020-12-22 DIAGNOSIS — J410 Simple chronic bronchitis: Secondary | ICD-10-CM

## 2020-12-22 DIAGNOSIS — F17210 Nicotine dependence, cigarettes, uncomplicated: Secondary | ICD-10-CM

## 2020-12-22 DIAGNOSIS — Z716 Tobacco abuse counseling: Secondary | ICD-10-CM

## 2020-12-22 DIAGNOSIS — J449 Chronic obstructive pulmonary disease, unspecified: Secondary | ICD-10-CM

## 2020-12-22 NOTE — Progress Notes (Signed)
Anne Montes    [id number]    Jan 26, 1956  Primary Care Physician:Timberlake, Meridee Score, MD Date of Appointment: 12/22/2020 Established Patient Visit  Chief complaint:   Chief Complaint  Patient presents with  . Follow-up    No complaints     HPI: Anne Montes is a 65 y.o. woman with COPD and tobacco use disorder  Interval Updates: Here for follow up after PFTs - gave her samples of spiriva at last visit. 1 puff once daily, this helps a lot, morning cough improved.  Still smoking but is down to 5 cigarettes a day. Trying to quit by doing paint by number. The early morning and after dinner cigarettes are hardest for her to quit. She has tried quitting once before but feels motivated.   Uses albuterol usually 1-2 times/day.   She is scheduled for LDCT for lung cancer screening next month.  I have reviewed the patient's family social and past medical history and updated as appropriate.   Past Medical History:  Diagnosis Date  . COPD (chronic obstructive pulmonary disease) (HCC)     History reviewed. No pertinent surgical history.  Family History  Problem Relation Age of Onset  . Lung cancer Mother        died at 54  . Cancer Father        oral cancer  . Emphysema Maternal Grandmother   . Emphysema Paternal Grandmother     Social History   Occupational History  . Not on file  Tobacco Use  . Smoking status: Current Every Day Smoker    Packs/day: 1.50    Years: 50.00    Pack years: 75.00    Types: Cigarettes    Start date: 10/31/1970  . Smokeless tobacco: Never Used  . Tobacco comment: down to 5 cig/day  Substance and Sexual Activity  . Alcohol use: Yes  . Drug use: No  . Sexual activity: Not on file     Physical Exam: Blood pressure (!) 150/88, pulse 71, temperature 98.5 F (36.9 C), height 5' (1.524 m), weight 116 lb (52.6 kg), SpO2 97 %.  Gen:      No acute distress Lungs:    No increased respiratory effort, symmetric chest wall  excursion, clear to auscultation bilaterally, no wheezes or crackles CV:         Regular rate and rhythm; no murmurs, rubs, or gallops.  No pedal edema   Data Reviewed: Imaging: PFTs: I have personally reviewed the patient's PFTs obtained 12/22/20 which shows moderate airflow limitation without BD response, hyperinflation and air trapping normal diffusion capacity.   Labs:  Immunization status: Immunization History  Administered Date(s) Administered  . PFIZER(Purple Top)SARS-COV-2 Vaccination 11/01/2019, 11/26/2019, 07/12/2020  . Pneumococcal Polysaccharide-23 01/18/2016    Assessment:  COPD, moderate FEV 1 68% of predicted Tobacco use disorder  Plan/Recommendations:  Continue spiriva. She is looking into patient assistance program for her inhalers through [employer].  Continue prn albuterol.   Follow up with LDCT screening program.   Smoking Cessation Counseling:  1. The patient is an everyday smoker and symptomatic due to the following condition COPD 2. The patient is currently contemplative in quitting smoking. 3. I advised patient to quit smoking. 4. We identified patient specific barriers to change.  5. I personally spent 3 minutes counseling the patient regarding tobacco use disorder. 6. We discussed management of stress and anxiety to help with smoking cessation, when applicable. 7. We discussed nicotine replacement therapy, Wellbutrin, Chantix  as possible options. 8. I advised setting a quit date. 9. Follow?up arranged with our office to continue ongoing discussions. 10.Resources given to patient including quit hotline.    Return to Care: Return in about 4 months (around 04/23/2021).   Durel Salts, MD Pulmonary and Critical Care Medicine [facility] Office:[phone]

## 2020-12-22 NOTE — Patient Instructions (Addendum)
The patient should have follow up scheduled with myself in 4 months.   Fill out the financial assistance paperwork for spiriva

## 2020-12-22 NOTE — Progress Notes (Signed)
Full PFT completed today ? ?

## 2020-12-23 LAB — CYTOLOGY - PAP
Comment: NEGATIVE
Diagnosis: NEGATIVE
High risk HPV: NEGATIVE

## 2020-12-28 LAB — PULMONARY FUNCTION TEST
DL/VA % pred: 73 %
DL/VA: 3.13 ml/min/mmHg/L
DLCO cor % pred: 75 %
DLCO cor: 14.33 ml/min/mmHg
DLCO unc % pred: 75 %
DLCO unc: 14.33 ml/min/mmHg
FEF 25-75 Post: 0.81 L/sec
FEF 25-75 Pre: 0.6 L/sec
FEF2575-%Change-Post: 33 %
FEF2575-%Pred-Post: 41 %
FEF2575-%Pred-Pre: 30 %
FEV1-%Change-Post: 7 %
FEV1-%Pred-Post: 73 %
FEV1-%Pred-Pre: 68 %
FEV1-Post: 1.55 L
FEV1-Pre: 1.44 L
FEV1FVC-%Change-Post: 0 %
FEV1FVC-%Pred-Pre: 69 %
FEV6-%Change-Post: 7 %
FEV6-%Pred-Post: 100 %
FEV6-%Pred-Pre: 93 %
FEV6-Post: 2.66 L
FEV6-Pre: 2.48 L
FEV6FVC-%Change-Post: 0 %
FEV6FVC-%Pred-Post: 95 %
FEV6FVC-%Pred-Pre: 95 %
FVC-%Change-Post: 6 %
FVC-%Pred-Post: 105 %
FVC-%Pred-Pre: 98 %
FVC-Post: 2.8 L
FVC-Pre: 2.62 L
Post FEV1/FVC ratio: 55 %
Post FEV6/FVC ratio: 95 %
Pre FEV1/FVC ratio: 55 %
Pre FEV6/FVC Ratio: 95 %
RV % pred: 181 %
RV: 3.35 L
TLC % pred: 129 %
TLC: 5.79 L

## 2021-01-04 ENCOUNTER — Other Ambulatory Visit: Payer: Self-pay

## 2021-01-04 ENCOUNTER — Ambulatory Visit
Admission: RE | Admit: 2021-01-04 | Discharge: 2021-01-04 | Disposition: A | Discharge status: Home / Self Care | Payer: No Typology Code available for payment source | Source: Ambulatory Visit | Attending: Acute Care | Admitting: Acute Care

## 2021-01-04 ENCOUNTER — Ambulatory Visit (INDEPENDENT_AMBULATORY_CARE_PROVIDER_SITE_OTHER): Payer: 59 | Admitting: Acute Care

## 2021-01-04 ENCOUNTER — Encounter: Payer: Self-pay | Admitting: Acute Care

## 2021-01-04 VITALS — BP 122/84 | HR 89 | Temp 97.8°F | Ht 60.0 in | Wt 115.7 lb

## 2021-01-04 DIAGNOSIS — F17210 Nicotine dependence, cigarettes, uncomplicated: Secondary | ICD-10-CM

## 2021-01-04 MED ORDER — SPIRIVA RESPIMAT 2.5 MCG/ACT IN AERS: 2.0000 | INHALATION_SPRAY | Freq: Every day | RESPIRATORY_TRACT | 0 refills | Status: DC

## 2021-01-04 NOTE — Progress Notes (Signed)
Shared Decision Making Visit Lung Cancer Screening Program [phone])   Eligibility: Age 65 y.o. Pack Years Smoking History Calculation 48 pack year smoking history (# packs/per year x # years smoked) Recent History of coughing up blood  no Unexplained weight loss? no ( >Than 15 pounds within the last 6 months ) Prior History Lung / other cancer no (Diagnosis within the last 5 years already requiring surveillance chest CT Scans). Smoking Status Current Smoker Former Smokers: Years since quit: NA  Quit Date: NA  Visit Components: Discussion included one or more decision making aids. yes Discussion included risk/benefits of screening. yes Discussion included potential follow up diagnostic testing for abnormal scans. yes Discussion included meaning and risk of over diagnosis. yes Discussion included meaning and risk of False Positives. yes Discussion included meaning of total radiation exposure. yes  Counseling Included: Importance of adherence to annual lung cancer LDCT screening. yes Impact of comorbidities on ability to participate in the program. yes Ability and willingness to under diagnostic treatment. yes  Smoking Cessation Counseling: Current Smokers:  Discussed importance of smoking cessation. yes Information about tobacco cessation classes and interventions provided to patient. yes Patient provided with "ticket" for LDCT Scan. yes Symptomatic Patient. no  Counseling NA Diagnosis Code: Tobacco Use Z72.0 Asymptomatic Patient yes  Counseling (Intermediate counseling: > three minutes counseling) [id number] Former Smokers:  Discussed the importance of maintaining cigarette abstinence. yes Diagnosis Code: Personal History of Nicotine Dependence. Z66.063 Information about tobacco cessation classes and interventions provided to patient. Yes Patient provided with "ticket" for LDCT Scan. yes Written Order for Lung Cancer Screening with LDCT placed in Epic. Yes (CT Chest Lung Cancer  Screening Low Dose W/O CM) [id number] Z12.2-Screening of respiratory organs Z87.891-Personal history of nicotine dependence  I have spent 25 minutes of face to face time with Ms. Langhorst discussing the risks and benefits of lung cancer screening. We viewed a power point together that explained in detail the above noted topics. We paused at intervals to allow for questions to be asked and answered to ensure understanding.We discussed that the single most powerful action that she can take to decrease her risk of developing lung cancer is to quit smoking. We discussed whether or not she is ready to commit to setting a quit date. We discussed options for tools to aid in quitting smoking including nicotine replacement therapy, non-nicotine medications, support groups, Quit Smart classes, and behavior modification. We discussed that often times setting smaller, more achievable goals, such as eliminating 1 cigarette a day for a week and then 2 cigarettes a day for a week can be helpful in slowly decreasing the number of cigarettes smoked. This allows for a sense of accomplishment as well as providing a clinical benefit. I gave her the " Be Stronger Than Your Excuses" card with contact information for community resources, classes, free nicotine replacement therapy, and access to mobile apps, text messaging, and on-line smoking cessation help. I have also given her my card and contact information in the event she needs to contact me. We discussed the time and location of the scan, and that either Abigail Miyamoto RN or I will call with the results within 24-48 hours of receiving them. I have offered her  a copy of the power point we viewed  as a resource in the event they need reinforcement of the concepts we discussed today in the office. The patient verbalized understanding of all of  the above and had no further questions upon leaving the office. They  have my contact information in the event they have any further  questions.  I spent 4-5 minutes counseling on smoking cessation and the health risks of continued tobacco abuse.  I explained to the patient that there has been a high incidence of coronary artery disease noted on these exams. I explained that this is a non-gated exam therefore degree or severity cannot be determined. This patient is currently on statin therapy. I have asked the patient to follow-up with their PCP regarding any incidental finding of coronary artery disease and management with diet or medication as their PCP  feels is clinically indicated. The patient verbalized understanding of the above and had no further questions upon completion of the visit.      Bevelyn Ngo, NP 01/04/2021

## 2021-01-04 NOTE — Patient Instructions (Signed)

## 2021-01-14 NOTE — Progress Notes (Signed)
Please call patient and let them  know their  low dose Ct was read as a Lung RADS 2: nodules that are benign in appearance and behavior with a very low likelihood of becoming a clinically active cancer due to size or lack of growth. Recommendation per radiology is for a repeat LDCT in 12 months. .Please let them  know we will order and schedule their  annual screening scan for 12/2021. Please let them  know there was notation of CAD on their  scan.  Please remind the patient  that this is a non-gated exam therefore degree or severity of disease  cannot be determined. Please have them  follow up with their PCP regarding potential risk factor modification, dietary therapy or pharmacologic therapy if clinically indicated. Pt.  is  currently on statin therapy. Please place order for annual  screening scan for  12/2021 and fax results to PCP. Thanks so much.  + CA calcifications. Have her follow up with PCP.

## 2021-01-15 ENCOUNTER — Encounter: Payer: Self-pay | Admitting: *Deleted

## 2021-01-15 DIAGNOSIS — F17210 Nicotine dependence, cigarettes, uncomplicated: Secondary | ICD-10-CM

## 2021-08-02 ENCOUNTER — Other Ambulatory Visit: Payer: Self-pay | Admitting: Family Medicine

## 2021-08-02 ENCOUNTER — Ambulatory Visit
Admission: RE | Admit: 2021-08-02 | Discharge: 2021-08-02 | Disposition: A | Discharge status: Home / Self Care | Payer: Managed Care, Other (non HMO) | Source: Ambulatory Visit | Attending: Family Medicine | Admitting: Family Medicine

## 2021-08-02 DIAGNOSIS — R059 Cough, unspecified: Secondary | ICD-10-CM

## 2022-01-04 ENCOUNTER — Ambulatory Visit
Admission: RE | Admit: 2022-01-04 | Discharge: 2022-01-04 | Disposition: A | Discharge status: Home / Self Care | Payer: Commercial Managed Care - HMO | Source: Ambulatory Visit | Attending: Family Medicine | Admitting: Family Medicine

## 2022-01-04 DIAGNOSIS — F17210 Nicotine dependence, cigarettes, uncomplicated: Secondary | ICD-10-CM

## 2022-01-06 ENCOUNTER — Other Ambulatory Visit: Payer: Self-pay

## 2022-01-06 DIAGNOSIS — Z122 Encounter for screening for malignant neoplasm of respiratory organs: Secondary | ICD-10-CM

## 2022-01-06 DIAGNOSIS — F17210 Nicotine dependence, cigarettes, uncomplicated: Secondary | ICD-10-CM

## 2022-01-06 DIAGNOSIS — Z87891 Personal history of nicotine dependence: Secondary | ICD-10-CM

## 2022-02-24 ENCOUNTER — Ambulatory Visit (INDEPENDENT_AMBULATORY_CARE_PROVIDER_SITE_OTHER): Payer: Commercial Managed Care - HMO | Admitting: Internal Medicine

## 2022-02-24 ENCOUNTER — Encounter: Payer: Self-pay | Admitting: Internal Medicine

## 2022-02-24 VITALS — BP 130/80 | HR 77 | Ht 61.0 in | Wt 117.4 lb

## 2022-02-24 DIAGNOSIS — J449 Chronic obstructive pulmonary disease, unspecified: Secondary | ICD-10-CM | POA: Diagnosis not present

## 2022-02-24 MED ORDER — ANORO ELLIPTA 62.5-25 MCG/ACT IN AEPB: 1.0000 | INHALATION_SPRAY | Freq: Every day | RESPIRATORY_TRACT | 11 refills | Status: DC

## 2022-02-24 MED ORDER — ALBUTEROL SULFATE HFA 108 (90 BASE) MCG/ACT IN AERS: 2.0000 | INHALATION_SPRAY | Freq: Four times a day (QID) | RESPIRATORY_TRACT | 11 refills | Status: DC | PRN

## 2022-02-24 MED ORDER — ANORO ELLIPTA 62.5-25 MCG/ACT IN AEPB: 1.0000 | INHALATION_SPRAY | Freq: Every day | RESPIRATORY_TRACT | Status: DC

## 2022-02-24 NOTE — Patient Instructions (Addendum)
Please schedule follow up scheduled with myself in 1 year.  If my schedule is not open yet, we will contact you with a reminder closer to that time. Please call [phone] if you haven't heard from [place] a month before.   Increasing your therapy from spiriva to anoro. Take 1 puff once a day.  I have refilled your albuterol.   Call me if any issues or flares with your breathing!   Your mucinex spray is only meant to be used for 3-5 days in a row for short term use and can cause your blood pressure to be elvated.   Flonase may be a better option for your nasal congestion long term.

## 2022-02-24 NOTE — Progress Notes (Signed)
Anne Montes    [id number]    06-07-1956  Primary Care Physician:Timberlake, Meridee Score, MD Date of Appointment: 02/24/2022 Established Patient Visit  Chief complaint:   Chief Complaint  Patient presents with   Follow-up    Follow-up: Yearly     HPI: Anne Montes is a 66 y.o. woman with COPD and tobacco use disorder  Interval Updates: Here for annual follow up for copd. Down to 2 cigarettes every few days.   Had two flares of copd over the last year and ended up getting steroids and abx with PCP. Things breathing also worse with heat, humidity and air quality. Last flare was 6-7 months ago. 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   Stopped cetirizine and is now taking oxymetazoline spray for her chronic rhinitis. Has been on this for several months. She does have high blood pressure.   Has been following with lung cancer screening program and last scan was June 2023  I have reviewed the patient's family social and past medical history and updated as appropriate.   Past Medical History:  Diagnosis Date   COPD (chronic obstructive pulmonary disease) (HCC)     History reviewed. No pertinent surgical history.  Family History  Problem Relation Age of Onset   Lung cancer Mother        died at 59   Cancer Father        oral cancer   Emphysema Maternal Grandmother    Emphysema Paternal Grandmother     Social History   Occupational History   Not on file  Tobacco Use   Smoking status: Every Day    Packs/day: 1.50    Years: 50.00    Total pack years: 75.00    Types: Cigarettes    Start date: 10/31/1970   Smokeless tobacco: Never   Tobacco comments:    down to 5 cig/day-01/04/21  Substance and Sexual Activity   Alcohol use: Yes   Drug use: No   Sexual activity: Not on file     Physical Exam: Blood pressure 130/80, pulse 77, height 5\' 1"  (1.549 m), weight 117 lb 6.4 oz (53.3 kg), SpO2 96 %.  Gen:      No acute  distress Lungs:    no increased wob no wheezes or crackles CV:        RRR no mrg   Data Reviewed: Imaging: CT Chest June 2023 reviewed PFTs: I have personally reviewed the patient's PFTs obtained 12/22/20 which shows moderate airflow limitation without BD response, hyperinflation and air trapping normal diffusion capacity.   Labs:  Immunization status: Immunization History  Administered Date(s) Administered   Influenza,inj,Quad PF,6+ Mos 08/02/2021   PFIZER(Purple Top)SARS-COV-2 Vaccination 11/01/2019, 11/26/2019, 07/12/2020, 12/26/2020   PNEUMOCOCCAL CONJUGATE-20 12/17/2020   Pneumococcal Polysaccharide-23 01/18/2016   Tdap 01/18/2016    Assessment:  COPD, moderate FEV 1 68% of predicted, with progression ( 2 flares in the last year) Tobacco use disorder Need for lung cancer screening.  Chronic rhinitis  Plan/Recommendations:  Will increase therapy from spiriva to anoro. Take 1 puff once a day.  I have refilled your albuterol.    Your mucinex spray is only meant to be used for 3-5 days in a row for short term use and can cause your blood pressure to be elvated.   Flonase may be a better option for your nasal congestion long term.   Follow up with LDCT screening program. Next due  for scan June 2024.    Return to Care: Return in about 1 year (around 02/25/2023).   Durel Salts, MD Pulmonary and Critical Care Medicine [facility] Office:[phone]

## 2022-02-24 NOTE — Addendum Note (Signed)
Addended by: Hedda Slade on: 02/24/2022 05:30 PM   Modules accepted: Orders

## 2022-03-17 ENCOUNTER — Encounter (HOSPITAL_BASED_OUTPATIENT_CLINIC_OR_DEPARTMENT_OTHER): Payer: Self-pay | Admitting: Obstetrics and Gynecology

## 2022-03-17 ENCOUNTER — Emergency Department (HOSPITAL_BASED_OUTPATIENT_CLINIC_OR_DEPARTMENT_OTHER): Payer: Commercial Managed Care - HMO | Admitting: Radiology

## 2022-03-17 ENCOUNTER — Other Ambulatory Visit: Payer: Self-pay

## 2022-03-17 ENCOUNTER — Emergency Department (HOSPITAL_BASED_OUTPATIENT_CLINIC_OR_DEPARTMENT_OTHER)
Admission: EM | Admit: 2022-03-17 | Discharge: 2022-03-17 | Disposition: A | Discharge status: Home / Self Care | Payer: Commercial Managed Care - HMO | Attending: Emergency Medicine | Admitting: Emergency Medicine

## 2022-03-17 ENCOUNTER — Emergency Department (HOSPITAL_BASED_OUTPATIENT_CLINIC_OR_DEPARTMENT_OTHER): Payer: Commercial Managed Care - HMO

## 2022-03-17 DIAGNOSIS — R1031 Right lower quadrant pain: Secondary | ICD-10-CM | POA: Insufficient documentation

## 2022-03-17 DIAGNOSIS — R110 Nausea: Secondary | ICD-10-CM | POA: Diagnosis not present

## 2022-03-17 DIAGNOSIS — R1013 Epigastric pain: Secondary | ICD-10-CM | POA: Diagnosis present

## 2022-03-17 DIAGNOSIS — R109 Unspecified abdominal pain: Secondary | ICD-10-CM

## 2022-03-17 DIAGNOSIS — E871 Hypo-osmolality and hyponatremia: Secondary | ICD-10-CM | POA: Diagnosis not present

## 2022-03-17 DIAGNOSIS — D72829 Elevated white blood cell count, unspecified: Secondary | ICD-10-CM | POA: Diagnosis not present

## 2022-03-17 DIAGNOSIS — R7309 Other abnormal glucose: Secondary | ICD-10-CM | POA: Insufficient documentation

## 2022-03-17 DIAGNOSIS — J449 Chronic obstructive pulmonary disease, unspecified: Secondary | ICD-10-CM | POA: Insufficient documentation

## 2022-03-17 LAB — CBC
HCT: 40.7 % (ref 36.0–46.0)
Hemoglobin: 14 g/dL (ref 12.0–15.0)
MCH: 33.3 pg (ref 26.0–34.0)
MCHC: 34.4 g/dL (ref 30.0–36.0)
MCV: 96.9 fL (ref 80.0–100.0)
Platelets: 191 10*3/uL (ref 150–400)
RBC: 4.2 MIL/uL (ref 3.87–5.11)
RDW: 13 % (ref 11.5–15.5)
WBC: 11.9 10*3/uL — ABNORMAL HIGH (ref 4.0–10.5)
nRBC: 0 % (ref 0.0–0.2)

## 2022-03-17 LAB — URINALYSIS, ROUTINE W REFLEX MICROSCOPIC
Bilirubin Urine: NEGATIVE
Glucose, UA: NEGATIVE mg/dL
Hgb urine dipstick: NEGATIVE
Ketones, ur: NEGATIVE mg/dL
Nitrite: NEGATIVE
Specific Gravity, Urine: 1.016 (ref 1.005–1.030)
pH: 8.5 — ABNORMAL HIGH (ref 5.0–8.0)

## 2022-03-17 LAB — COMPREHENSIVE METABOLIC PANEL
ALT: 15 U/L (ref 0–44)
AST: 14 U/L — ABNORMAL LOW (ref 15–41)
Albumin: 4.4 g/dL (ref 3.5–5.0)
Alkaline Phosphatase: 61 U/L (ref 38–126)
Anion gap: 9 (ref 5–15)
BUN: 8 mg/dL (ref 8–23)
CO2: 27 mmol/L (ref 22–32)
Calcium: 9.9 mg/dL (ref 8.9–10.3)
Chloride: 98 mmol/L (ref 98–111)
Creatinine, Ser: 0.86 mg/dL (ref 0.44–1.00)
GFR, Estimated: >60 mL/min (ref >60)
Glucose, Bld: 119 mg/dL — ABNORMAL HIGH (ref 70–99)
Potassium: 3.7 mmol/L (ref 3.5–5.1)
Sodium: 134 mmol/L — ABNORMAL LOW (ref 135–145)
Total Bilirubin: 0.7 mg/dL (ref 0.3–1.2)
Total Protein: 7.2 g/dL (ref 6.5–8.1)

## 2022-03-17 LAB — TROPONIN I (HIGH SENSITIVITY)
Troponin I (High Sensitivity): 3 ng/L (ref <18)
Troponin I (High Sensitivity): 5 ng/L (ref <18)

## 2022-03-17 LAB — LIPASE, BLOOD: Lipase: <10 U/L — ABNORMAL LOW (ref 11–51)

## 2022-03-17 MED ORDER — ONDANSETRON HCL 4 MG/2ML IJ SOLN
4.0000 mg | Freq: Once | INTRAMUSCULAR | Status: AC
  Administered 2022-03-17: 4 mg via INTRAVENOUS
  Filled 2022-03-17: qty 2

## 2022-03-17 MED ORDER — MORPHINE SULFATE (PF) 4 MG/ML IV SOLN
4.0000 mg | Freq: Once | INTRAVENOUS | Status: AC
  Administered 2022-03-17: 4 mg via INTRAVENOUS
  Filled 2022-03-17: qty 1

## 2022-03-17 MED ORDER — IOHEXOL 300 MG/ML  SOLN
100.0000 mL | Freq: Once | INTRAMUSCULAR | Status: AC | PRN
  Administered 2022-03-17: 75 mL via INTRAVENOUS

## 2022-03-17 MED ORDER — SODIUM CHLORIDE 0.9 % IV BOLUS
1000.0000 mL | Freq: Once | INTRAVENOUS | Status: AC
  Administered 2022-03-17: 1000 mL via INTRAVENOUS

## 2022-03-17 MED ORDER — ONDANSETRON HCL 4 MG PO TABS: 4.0000 mg | ORAL_TABLET | Freq: Four times a day (QID) | ORAL | 0 refills | Status: DC

## 2022-03-17 MED ORDER — DICYCLOMINE HCL 20 MG PO TABS: 20.0000 mg | ORAL_TABLET | Freq: Two times a day (BID) | ORAL | 0 refills | Status: DC

## 2022-03-17 NOTE — ED Triage Notes (Signed)
Patient reports abdominal pain since 0300 this morning. Reports nausea without emesis. Patient reports she feels like an elephant is sitting on her chest

## 2022-03-17 NOTE — Discharge Instructions (Addendum)
It was a pleasure taking care of you today!  Your labs and imaging are overall unremarkable today.  Attached is information for the on-call gastroenterologist, call and set up a follow-up appointment regarding today's ED visit.  Cardiopulmonary care provider to set up a follow-up appoint regarding today's ED visit.  At home you will want to consume a bland diet consistent of bread, rice, toast, soup, water, tea.  You will be sent a prescription for Bentyl as well as Zofran, take as directed.  Your CT scan show concern for right renal cyst, it is recommended that you follow-up within MRI in 6 months for further evaluation. Also showed concern for abdominal aortic aneurysm (bulging of the aorta), you will need this to be monitored with an [place] every 3 years. Return to the emergency department for experiencing increasing/worsening abdominal pain, vomiting, decreased fluid intake, worsening symptoms.

## 2022-03-17 NOTE — ED Notes (Signed)
Discharge instructions, follow up care, and prescriptions reviewed and explained, pt verbalized understanding. Pt caox4 and ambulatory on d/c.  

## 2022-03-17 NOTE — ED Provider Notes (Signed)
[facility] EMERGENCY DEPT Provider Note   CSN: [id number] Arrival date & time: 03/17/22  1255     History  Chief Complaint  Patient presents with   Abdominal Pain    Anne Montes is a 66 y.o. female who presents to the emergency department with concerns for acute epigastric/mid abdominal pain onset 3 AM this morning.  Was evaluated by her primary care provider who told her to come to the ED for evaluation of her symptoms.  Still has her gallbladder and appendix.  Has associated nausea.  Patient also notes that it feels like there is an elephant sitting on her chest as well as chest tightness.  Has a history of COPD and had to use her inhaler once today.  No additional medications tried prior to arrival.  Denies vomiting, fever, urinary symptoms, shortness of breath.  The history is provided by the patient. No language interpreter was used.       Home Medications Prior to Admission medications   Medication Sig Start Date End Date Taking? Authorizing Provider  dicyclomine (BENTYL) 20 MG tablet Take 1 tablet (20 mg total) by mouth 2 (two) times daily. 03/17/22  Yes Jazmen Lindenbaum A, PA-C  ondansetron (ZOFRAN) 4 MG tablet Take 1 tablet (4 mg total) by mouth every 6 (six) hours. 03/17/22  Yes Genoa Freyre A, PA-C  albuterol (VENTOLIN HFA) 108 (90 Base) MCG/ACT inhaler Inhale 2 puffs into the lungs every 6 (six) hours as needed for shortness of breath. 02/24/22   Charlott Holler, MD  buPROPion (WELLBUTRIN XL) 150 MG 24 hr tablet 1 tablet in the morning 10/05/20   [provider]  nicotine (NICODERM CQ - DOSED IN MG/24 HOURS) 14 mg/24hr patch 1 patch to skin 10/05/20   [provider]  nicotine (NICODERM CQ - DOSED IN MG/24 HR) 7 mg/24hr patch 7 mg daily. 12/17/20   [provider]  oxymetazoline (MUCINEX SINUS-MAX SINUS/ALLRGY) 0.05 % nasal spray Place 1 spray into both nostrils 2 (two) times daily.    [provider]  rosuvastatin (CRESTOR) 5  MG tablet Take 5 mg by mouth daily. 10/13/20   [provider]  umeclidinium-vilanterol (ANORO ELLIPTA) 62.5-25 MCG/ACT AEPB Inhale 1 puff into the lungs daily. 02/24/22   Charlott Holler, MD  umeclidinium-vilanterol [facility] ELLIPTA) 62.5-25 MCG/ACT AEPB Inhale 1 puff into the lungs daily. 02/24/22   Charlott Holler, MD      Allergies    Patient has no known allergies.    Review of Systems   Review of Systems  Constitutional:  Negative for fever.  Respiratory:  Positive for chest tightness. Negative for shortness of breath.   Cardiovascular:  Negative for chest pain.  Gastrointestinal:  Positive for abdominal pain and nausea. Negative for vomiting.  Genitourinary:  Negative for dysuria and hematuria.  All other systems reviewed and are negative.   Physical Exam Updated Vital Signs BP 138/81   Pulse 81   Temp 99.1 F (37.3 C) (Oral)   Resp 16   Ht 5\' 1"  (1.549 m)   Wt 53.1 kg   SpO2 96%   BMI 22.11 kg/m  Physical Exam Vitals and nursing note reviewed.  Constitutional:      General: She is not in acute distress.    Appearance: She is not diaphoretic.  HENT:     Head: Normocephalic and atraumatic.     Mouth/Throat:     Pharynx: No oropharyngeal exudate.  Eyes:     General: No scleral icterus.  Conjunctiva/sclera: Conjunctivae normal.  Cardiovascular:     Rate and Rhythm: Normal rate and regular rhythm.     Pulses: Normal pulses.     Heart sounds: Normal heart sounds.  Pulmonary:     Effort: Pulmonary effort is normal. No respiratory distress.     Breath sounds: Normal breath sounds. No wheezing.  Abdominal:     General: Bowel sounds are normal.     Palpations: Abdomen is soft. There is no mass.     Tenderness: There is abdominal tenderness in the epigastric area and suprapubic area. There is no guarding or rebound.     Comments: Tenderness to palpation noted to epigastric and suprapubic region.  Mild tenderness to palpation noted to right lower quadrant region.   Musculoskeletal:        General: Normal range of motion.     Cervical back: Normal range of motion and neck supple.  Skin:    General: Skin is warm and dry.  Neurological:     Mental Status: She is alert.  Psychiatric:        Behavior: Behavior normal.     ED Results / Procedures / Treatments   Labs (all labs ordered are listed, but only abnormal results are displayed) Labs Reviewed  LIPASE, BLOOD - Abnormal; Notable for the following components:      Result Value   Lipase <10 (*)    All other components within normal limits  COMPREHENSIVE METABOLIC PANEL - Abnormal; Notable for the following components:   Sodium 134 (*)    Glucose, Bld 119 (*)    AST 14 (*)    All other components within normal limits  CBC - Abnormal; Notable for the following components:   WBC 11.9 (*)    All other components within normal limits  URINALYSIS, ROUTINE W REFLEX MICROSCOPIC - Abnormal; Notable for the following components:   APPearance HAZY (*)    pH 8.5 (*)    Protein, ur TRACE (*)    Leukocytes,Ua SMALL (*)    Bacteria, UA RARE (*)    All other components within normal limits  TROPONIN I (HIGH SENSITIVITY)  TROPONIN I (HIGH SENSITIVITY)    EKG EKG Interpretation  Date/Time:  Thursday March 17 2022 13:15:35 EDT Ventricular Rate:  77 PR Interval:  141 QRS Duration: 89 QT Interval:  349 QTC Calculation: 395 R Axis:   79 Text Interpretation: Sinus rhythm Right atrial enlargement Confirmed by Vanetta Mulders [phone]) on 03/17/2022 5:47:09 PM  Radiology CT ABDOMEN PELVIS W CONTRAST  Result Date: 03/17/2022 CLINICAL DATA:  Nausea/vomiting mid abdominal pain EXAM: CT ABDOMEN AND PELVIS WITH CONTRAST TECHNIQUE: Multidetector CT imaging of the abdomen and pelvis was performed using the standard protocol following bolus administration of intravenous contrast. RADIATION DOSE REDUCTION: This exam was performed according to the departmental dose-optimization program which includes automated  exposure control, adjustment of the mA and/or kV according to patient size and/or use of iterative reconstruction technique. CONTRAST:  28mL OMNIPAQUE IOHEXOL 300 MG/ML  SOLN COMPARISON:  04/16/2017 FINDINGS: Lower chest: Included lung bases are clear.  Heart size is normal. Hepatobiliary: 2 subcentimeter low-density lesions within the liver, too small to characterize. Liver otherwise unremarkable. Unremarkable gallbladder. No hyperdense gallstone. No biliary dilatation. Pancreas: Unremarkable. No pancreatic ductal dilatation or surrounding inflammatory changes. Spleen: Normal in size without focal abnormality. Adrenals/Urinary Tract: Unremarkable adrenal glands. 1.8 cm cyst with slightly thickened enhancing internal septation in the interpolar region of the right kidney (series 5, images 29-30). A cyst was  present at this location in 2018 although the septation seen on today's study was not apparent. Kidneys are otherwise unremarkable. No left renal lesion. No renal stone or hydronephrosis. Urinary bladder within normal limits. Stomach/Bowel: Stomach is within normal limits. Appendix not definitively identified. No evidence of bowel wall thickening, distention, or inflammatory changes. Vascular/Lymphatic: Aortic atherosclerosis with mural thrombus. Infrarenal abdominal aorta measures up to 3.0 cm in maximal dimension (series 2, image 34). No abdominopelvic lymphadenopathy. Reproductive: Uterus and bilateral adnexa are unremarkable. Other: No free fluid. No abdominopelvic fluid collection. No pneumoperitoneum. No abdominal wall hernia. Musculoskeletal: No acute or significant osseous findings. Mild degenerative facet arthropathy of the lumbar spine. IMPRESSION: 1. No acute abdominopelvic findings. 2. Bosniak IIF right renal cystic lesion. The large majority of Bosniak IIF masses are benign. When malignant, nearly all are indolent. Generally, Bosniak IIF masses are followed by imaging at 6 months and 12 months (MRI  preferred over CT), then annually for a total of 5 years to assess for morphologic change. (Reference: Bosniak Classification of Cystic Renal Masses, Version 2019. Radiology 2019; 292(2): 475-488.) 3. Infrarenal abdominal aortic aneurysm measuring up to 3.0 cm. Recommend follow-up ultrasound every 3 years. This recommendation follows ACR consensus guidelines: White Paper of the ACR Incidental Findings Committee II on Vascular Findings. J Am Coll Radiol 2013; 13:086-578; 10:789-794. 4. Aortic Atherosclerosis (ICD10-I70.0). Electronically Signed   By: Duanne GuessNicholas  Plundo D.O.   On: 03/17/2022 15:46   DG Chest 2 View  Result Date: 03/17/2022 CLINICAL DATA:  Chest tightness EXAM: CHEST - 2 VIEW COMPARISON:  Chest 08/02/2021 FINDINGS: The heart size and mediastinal contours are within normal limits. Both lungs are clear. The visualized skeletal structures are unremarkable. Small calcified granuloma right upper lobe unchanged. IMPRESSION: No active cardiopulmonary disease. Electronically Signed   By: Marlan Palauharles  Clark M.D.   On: 03/17/2022 14:20    Procedures Procedures    Medications Ordered in ED Medications  sodium chloride 0.9 % bolus 1,000 mL (0 mLs Intravenous Stopped 03/17/22 1724)  morphine (PF) 4 MG/ML injection 4 mg (4 mg Intravenous Given 03/17/22 1441)  ondansetron (ZOFRAN) injection 4 mg (4 mg Intravenous Given 03/17/22 1440)  iohexol (OMNIPAQUE) 300 MG/ML solution 100 mL (75 mLs Intravenous Contrast Given 03/17/22 1502)    ED Course/ Medical Decision Making/ A&P Clinical Course as of 03/18/22 1233  Thu Mar 17, 2022  1416 Re-evaluated and drinking soda without difficulty.  Discussed with patient lab and imaging findings.  Answered all available questions.  Patient notes that she recently completed a course of steroids and antibiotics. [SB]  1547 Patient re-evaluated and pain improved with treatment regimen in the ED. [SB]  1720 Re-evaluated and patient resting comfortably on the stretcher on her phone.   Notes her pain is resolved at this time. [SB]  1829 Patient reevaluated and noted improvement of symptoms in the ED. Discussed with patient remainder of lab and imaging findings.  Discussed with patient incidental findings on CT scan notable for right renal cyst as well as aneurysm of the abdominal aorta, discussed with patient importance of maintaining follow-up with her primary care provider for imaging follow-up in the outpatient setting.  Patient agreeable at this time.  Discussed with patient discharge treatment plan.  Patient appears safe for discharge at this time. [SB]    Clinical Course User Index [SB] Bless Belshe A, PA-C  Medical Decision Making Amount and/or Complexity of Data Reviewed Labs: ordered. Radiology: ordered.  Risk Prescription drug management.   Patient presents to the emergency department with concerns for acute epigastric/mid abdominal pain onset 3 AM this morning.  Still has her gallbladder and appendix.  Has associated nausea.  Also notes feels as if there is an elephant sitting on her chest with chest tightness.  Has a history of COPD and has used her inhaler today once.  Recently ended a course of steroids and antibiotics.  Vital signs stable, patient afebrile.  On exam patient with tenderness to palpation noted to epigastric and suprapubic region.  Expiratory wheezing noted throughout all lung fields.  Differential diagnosis includes pancreatitis, cholecystitis, appendicitis, acute cystitis, acute abdomen, GERD, ACS, pneumonia, COPD exacerbation.   Labs:  I ordered, and personally interpreted labs.  The pertinent results include:  Initial troponin at 3 delta troponin at 5 Lipase undetectable CBC with mild leukocytosis at 11.9 otherwise unremarkable (patient recently finished a course of steroids for a COPD exacerbation) CMP with slightly decreased sodium at 134, slightly elevated glucose of 119 otherwise unremarkable. Urinalysis  unremarkable  Imaging: I ordered imaging studies including chest x-ray, CT abdomen pelvis I independently visualized and interpreted imaging which showed no acute abdominal/pelvic findings. 1. No acute abdominopelvic findings.  2. Bosniak IIF right renal cystic lesion.   3. Infrarenal abdominal aortic aneurysm measuring up to 3.0 cm.  4. Aortic Atherosclerosis (ICD10-I70.0).   I agree with the radiologist interpretation  Medications:  I ordered medication including morphine, IV fluids, Zofran for management.  Reevaluation of the patient after these medicines and interventions, I reevaluated the patient and found that they have improved I have reviewed the patients home medicines and have made adjustments as needed Tolerated PO challenge in ED with above treatment regimen   Disposition: Presentation suspicious for viral etiology of abdominal pain.  Doubt ACS, pancreatitis, cholecystitis, acute cystitis at this time. After consideration of the diagnostic results and the patients response to treatment, I feel that the patient would benefit from Discharge home.  Patient will be discharged home with a prescription for Zofran and Bentyl.  Will provide patient with information for on-call gastroenterologist.  Instructed patient to call and set up a follow-up appointment regarding today's visit.  Discussed with patient to follow-up with primary care provider regarding today's ED visit.  Also discussed with patient regarding incidental findings on CT of right renal cystic lesion and follow-up recommendations for that as well as infrarenal abdominal aortic aneurysm and recommendations for follow-up for that.  Supportive care measures and strict return precautions discussed with patient at bedside. Pt acknowledges and verbalizes understanding. Pt appears safe for discharge. Follow up as indicated in discharge paperwork.   This chart was dictated using voice recognition software, Dragon. Despite the best  efforts of this provider to proofread and correct errors, errors may still occur which can change documentation meaning.   Final Clinical Impression(s) / ED Diagnoses Final diagnoses:  Abdominal pain, unspecified abdominal location    Rx / DC Orders ED Discharge Orders          Ordered    ondansetron (ZOFRAN) 4 MG tablet  Every 6 hours        03/17/22 1832    dicyclomine (BENTYL) 20 MG tablet  2 times daily        03/17/22 1832              Irina Okelly A, PA-C 03/18/22 1233  Glyn Ade, MD 03/18/22 [phone]

## 2022-04-07 ENCOUNTER — Encounter: Payer: Self-pay | Admitting: Internal Medicine

## 2022-04-29 ENCOUNTER — Other Ambulatory Visit: Payer: Self-pay | Admitting: Internal Medicine

## 2022-04-29 DIAGNOSIS — J4489 Other specified chronic obstructive pulmonary disease: Secondary | ICD-10-CM

## 2022-04-29 MED ORDER — ANORO ELLIPTA 62.5-25 MCG/ACT IN AEPB: 1.0000 | INHALATION_SPRAY | Freq: Every day | RESPIRATORY_TRACT | 3 refills | Status: DC

## 2022-06-08 IMAGING — CT CT CHEST LUNG CANCER SCREENING LOW DOSE W/O CM
1 of 2 series · 10 of 20 positions shown, 13 images · non-contrast
Comparison: None

CLINICAL DATA: Lung cancer screening. Forty-eight pack-year
history. Current asymptomatic smoker.

EXAM:
CT CHEST WITHOUT CONTRAST LOW-DOSE FOR LUNG CANCER SCREENING
TECHNIQUE: Multidetector CT imaging of the chest was performed following the
standard protocol without IV contrast.

[ct lung segmentation data · axial · 0.62mm/px · z∈[-299,-299]mm · 10 of 335 frames shown]
[frame 1/335  mediastinal]
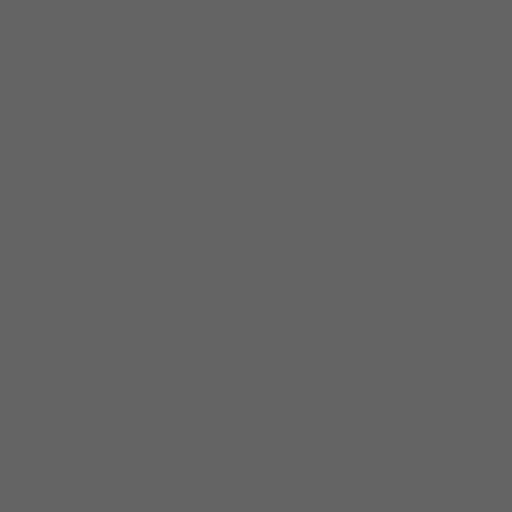
[frame 1/335  lung]
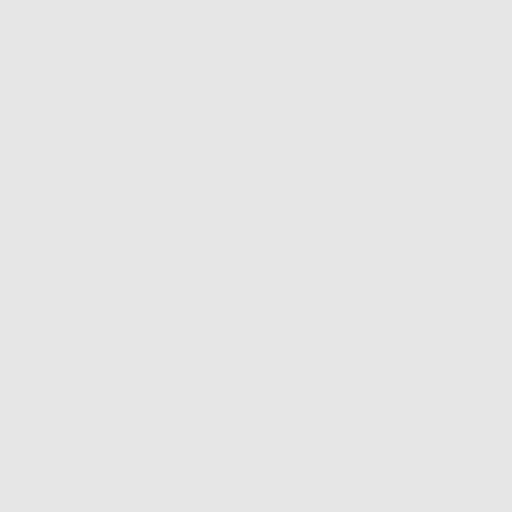
[frame 38/335  lung]
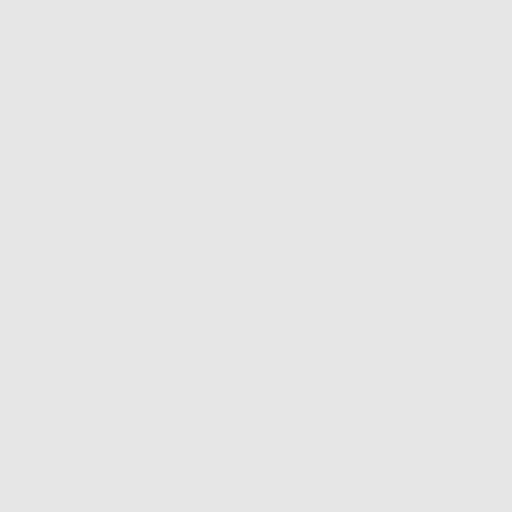
[frame 75/335  lung]
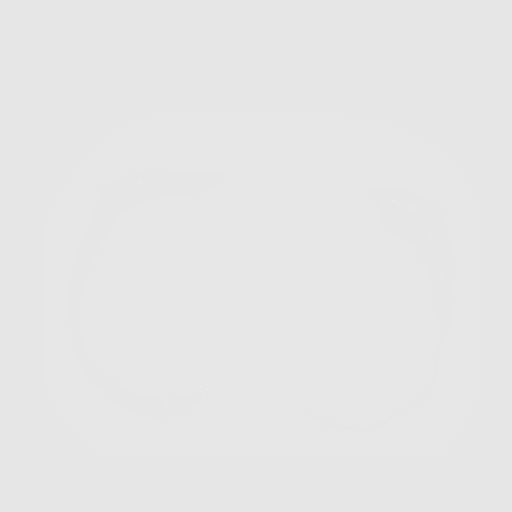
[frame 112/335  lung]
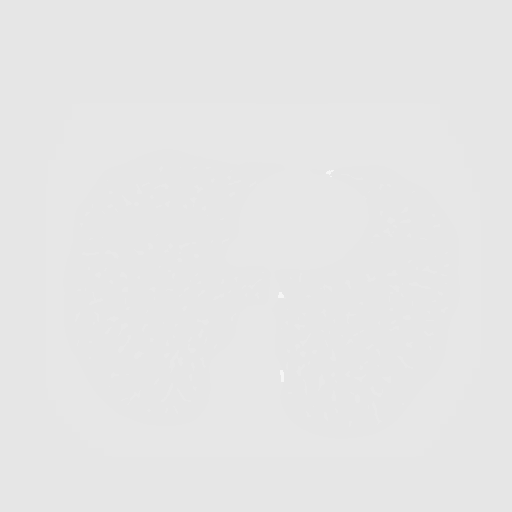
[frame 149/335  mediastinal]
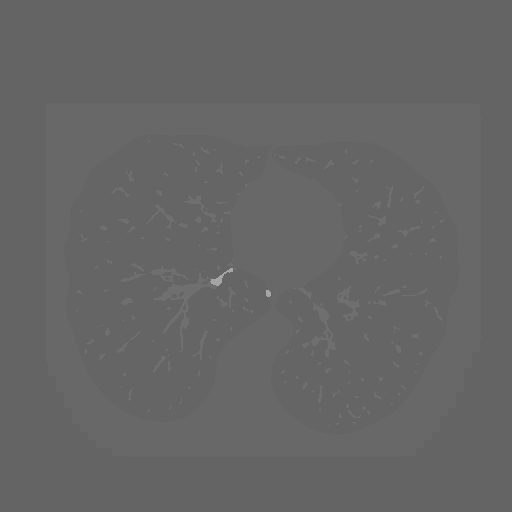
[frame 149/335  lung]
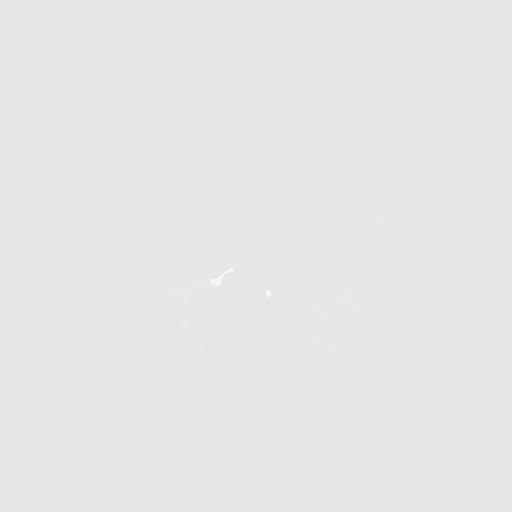
[frame 186/335  lung]
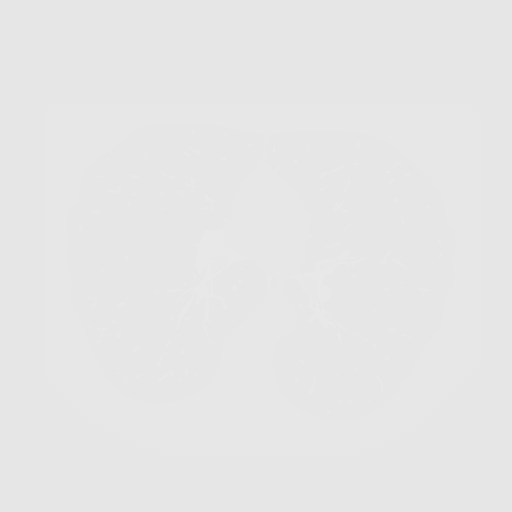
[frame 223/335  lung]
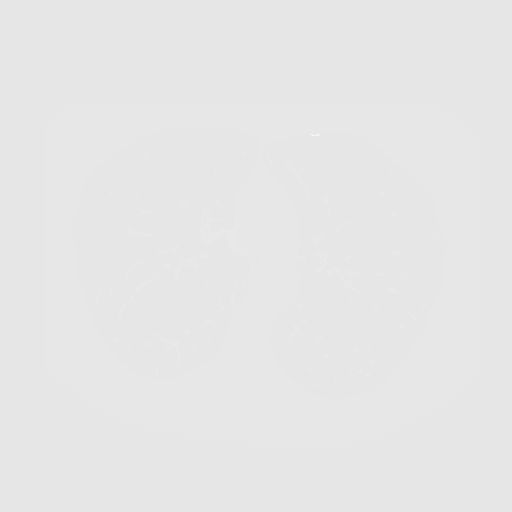
[frame 260/335  lung]
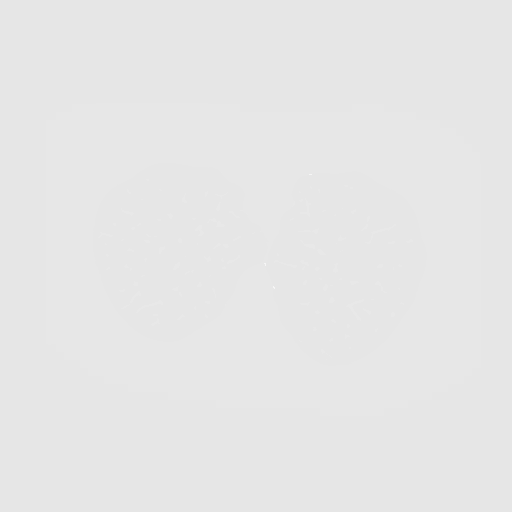
[frame 297/335  mediastinal]
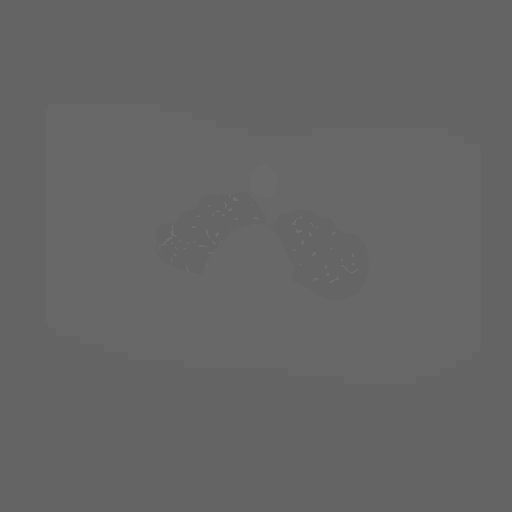
[frame 297/335  lung]
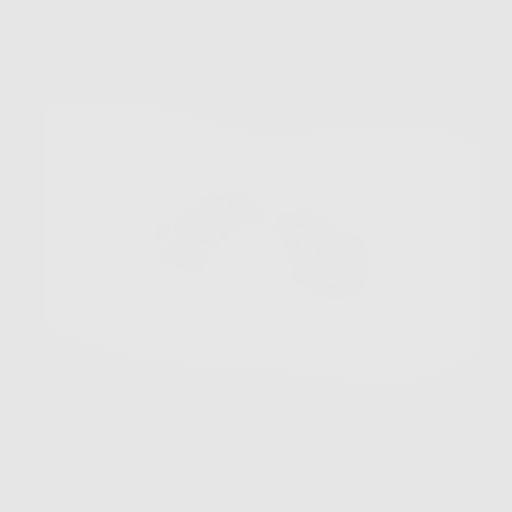
[frame 335/335  lung]
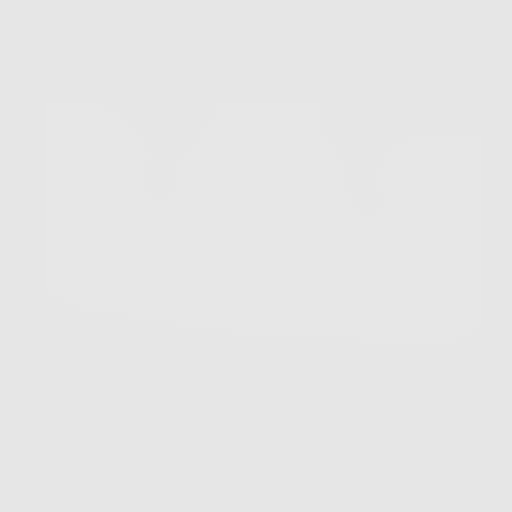

[10 of 20 positions shown; findings below may reference images not displayed]

FINDINGS: Cardiovascular: The heart size appears within normal limits. No
pericardial effusion identified. Aortic atherosclerosis and coronary
artery calcifications.

Mediastinum/Nodes: No enlarged mediastinal, hilar, or axillary lymph
nodes. Thyroid gland, trachea, and esophagus demonstrate no
significant findings.

Lungs/Pleura: Advanced changes of centrilobular and paraseptal
emphysema. No pleural effusion, airspace consolidation, or
atelectasis. Multiple calcified and noncalcified lung nodules are
identified in both lungs. The largest noncalcified nodule is in the
medial right apex with a mean derived diameter 5.1 mm.

Upper Abdomen: No acute abnormality. The small cyst within left
hepatic lobe measures 6 mm, image 52/2. Similar to CT AP from
04/16/2017. There is also a small cyst within segment 4 B measuring
6 mm on today's study. This was also present on study from 6927
compatible with a benign abnormality. Aortic atherosclerosis noted.

Musculoskeletal: No chest wall mass or suspicious bone lesions
identified. Degenerative disc disease noted within the thoracic
spine. Discogenic sclerosis noted along the undersurface of the
anterior superior endplate of T9.
IMPRESSION: 1. Lung-RADS 2, benign appearance or behavior. Continue annual
screening with low-dose chest CT without contrast in 12 months.
2. Coronary artery calcifications.

Aortic Atherosclerosis (C1XGX-LK2.2) and Emphysema (C1XGX-MWO.F).

## 2022-12-19 ENCOUNTER — Other Ambulatory Visit: Payer: Self-pay | Admitting: Acute Care

## 2022-12-19 DIAGNOSIS — F17210 Nicotine dependence, cigarettes, uncomplicated: Secondary | ICD-10-CM

## 2022-12-19 DIAGNOSIS — Z87891 Personal history of nicotine dependence: Secondary | ICD-10-CM

## 2022-12-19 DIAGNOSIS — Z122 Encounter for screening for malignant neoplasm of respiratory organs: Secondary | ICD-10-CM

## 2022-12-21 ENCOUNTER — Emergency Department (HOSPITAL_BASED_OUTPATIENT_CLINIC_OR_DEPARTMENT_OTHER)
Admission: EM | Admit: 2022-12-21 | Discharge: 2022-12-21 | Disposition: A | Discharge status: Home / Self Care | Payer: Medicare Other | Attending: Emergency Medicine | Admitting: Emergency Medicine

## 2022-12-21 ENCOUNTER — Emergency Department (HOSPITAL_BASED_OUTPATIENT_CLINIC_OR_DEPARTMENT_OTHER): Payer: Medicare Other

## 2022-12-21 ENCOUNTER — Encounter (HOSPITAL_BASED_OUTPATIENT_CLINIC_OR_DEPARTMENT_OTHER): Payer: Self-pay | Admitting: Emergency Medicine

## 2022-12-21 ENCOUNTER — Emergency Department (HOSPITAL_BASED_OUTPATIENT_CLINIC_OR_DEPARTMENT_OTHER): Payer: Medicare Other | Admitting: Radiology

## 2022-12-21 ENCOUNTER — Other Ambulatory Visit: Payer: Self-pay

## 2022-12-21 DIAGNOSIS — K7689 Other specified diseases of liver: Secondary | ICD-10-CM | POA: Diagnosis not present

## 2022-12-21 DIAGNOSIS — R109 Unspecified abdominal pain: Secondary | ICD-10-CM | POA: Diagnosis not present

## 2022-12-21 DIAGNOSIS — J449 Chronic obstructive pulmonary disease, unspecified: Secondary | ICD-10-CM | POA: Diagnosis not present

## 2022-12-21 DIAGNOSIS — R112 Nausea with vomiting, unspecified: Secondary | ICD-10-CM | POA: Diagnosis not present

## 2022-12-21 DIAGNOSIS — R1084 Generalized abdominal pain: Secondary | ICD-10-CM | POA: Insufficient documentation

## 2022-12-21 DIAGNOSIS — R079 Chest pain, unspecified: Secondary | ICD-10-CM | POA: Insufficient documentation

## 2022-12-21 DIAGNOSIS — Z7951 Long term (current) use of inhaled steroids: Secondary | ICD-10-CM | POA: Diagnosis not present

## 2022-12-21 DIAGNOSIS — N281 Cyst of kidney, acquired: Secondary | ICD-10-CM | POA: Diagnosis not present

## 2022-12-21 DIAGNOSIS — I10 Essential (primary) hypertension: Secondary | ICD-10-CM | POA: Insufficient documentation

## 2022-12-21 HISTORY — DX: Pure hypercholesterolemia, unspecified: E78.00

## 2022-12-21 HISTORY — DX: Essential (primary) hypertension: I10

## 2022-12-21 LAB — CBC
HCT: 40.1 % (ref 36.0–46.0)
Hemoglobin: 13.8 g/dL (ref 12.0–15.0)
MCH: 32.6 pg (ref 26.0–34.0)
MCHC: 34.4 g/dL (ref 30.0–36.0)
MCV: 94.8 fL (ref 80.0–100.0)
Platelets: 260 K/uL (ref 150–400)
RBC: 4.23 MIL/uL (ref 3.87–5.11)
RDW: 12 % (ref 11.5–15.5)
WBC: 8.6 K/uL (ref 4.0–10.5)
nRBC: 0 % (ref 0.0–0.2)

## 2022-12-21 LAB — COMPREHENSIVE METABOLIC PANEL WITH GFR
ALT: 15 U/L (ref 0–44)
AST: 19 U/L (ref 15–41)
Albumin: 4.8 g/dL (ref 3.5–5.0)
Alkaline Phosphatase: 74 U/L (ref 38–126)
Anion gap: 11 (ref 5–15)
BUN: 10 mg/dL (ref 8–23)
CO2: 25 mmol/L (ref 22–32)
Calcium: 9.3 mg/dL (ref 8.9–10.3)
Chloride: 101 mmol/L (ref 98–111)
Creatinine, Ser: 0.88 mg/dL (ref 0.44–1.00)
GFR, Estimated: >60 mL/min
Glucose, Bld: 106 mg/dL — ABNORMAL HIGH (ref 70–99)
Potassium: 4.2 mmol/L (ref 3.5–5.1)
Sodium: 137 mmol/L (ref 135–145)
Total Bilirubin: 0.4 mg/dL (ref 0.3–1.2)
Total Protein: 6.8 g/dL (ref 6.5–8.1)

## 2022-12-21 LAB — URINALYSIS, ROUTINE W REFLEX MICROSCOPIC
Bilirubin Urine: NEGATIVE
Glucose, UA: NEGATIVE mg/dL
Hgb urine dipstick: NEGATIVE
Ketones, ur: NEGATIVE mg/dL
Leukocytes,Ua: NEGATIVE
Nitrite: NEGATIVE
Protein, ur: NEGATIVE mg/dL
Specific Gravity, Urine: 1.036 — ABNORMAL HIGH (ref 1.005–1.030)
pH: 7.5 (ref 5.0–8.0)

## 2022-12-21 LAB — TROPONIN I (HIGH SENSITIVITY)
Troponin I (High Sensitivity): 4 ng/L
Troponin I (High Sensitivity): 5 ng/L

## 2022-12-21 LAB — LIPASE, BLOOD: Lipase: 10 U/L — ABNORMAL LOW (ref 11–51)

## 2022-12-21 MED ORDER — MORPHINE SULFATE (PF) 4 MG/ML IV SOLN
4.0000 mg | Freq: Once | INTRAVENOUS | Status: AC
  Administered 2022-12-21: 4 mg via INTRAVENOUS
  Filled 2022-12-21: qty 1

## 2022-12-21 MED ORDER — ONDANSETRON 4 MG PO TBDP: 4.0000 mg | ORAL_TABLET | Freq: Three times a day (TID) | ORAL | 0 refills | Status: DC | PRN

## 2022-12-21 MED ORDER — OXYCODONE-ACETAMINOPHEN 5-325 MG PO TABS: 1.0000 | ORAL_TABLET | Freq: Four times a day (QID) | ORAL | 0 refills | Status: DC | PRN

## 2022-12-21 MED ORDER — ONDANSETRON HCL 4 MG/2ML IJ SOLN
4.0000 mg | Freq: Once | INTRAMUSCULAR | Status: AC
  Administered 2022-12-21: 4 mg via INTRAVENOUS
  Filled 2022-12-21: qty 2

## 2022-12-21 MED ORDER — IOHEXOL 300 MG/ML  SOLN
100.0000 mL | Freq: Once | INTRAMUSCULAR | Status: AC | PRN
  Administered 2022-12-21: 75 mL via INTRAVENOUS

## 2022-12-21 NOTE — ED Triage Notes (Signed)
Pt arrives pov, steady gait with c/o upper abd pain with n/v and shob and chest heaviness that started at ~ 0500 today. Pt speaking in complete sentences, O2 sats 99% RA

## 2022-12-21 NOTE — Discharge Instructions (Addendum)
Your CT scan today showed a right renal cyst and a 2.9 cm abdominal aortic aneurysm which is stable from last CT scan.  Please take your medications as prescribed. Take tylenol/ibuprofen or Percocet as needed for pain. I recommend close follow-up with PCP and gastroenterology for reevaluation.  Please do not hesitate to return to emergency department if worrisome signs symptoms we discussed become apparent.

## 2022-12-21 NOTE — ED Notes (Signed)
Discharge instructions provided by edp were reinforced with pt. Pt verbalized understanding with no additional questions at this time. Pharmacy verified with pt. Pt to go home with spouse.

## 2022-12-21 NOTE — ED Notes (Signed)
Out to CT 

## 2022-12-21 NOTE — ED Provider Notes (Signed)
[place] [facility] AT [facility] Provider Note   CSN: [id number] Arrival date & time: 12/21/22  1608     History  Chief Complaint  Patient presents with   Abdominal Pain    Anne Montes is a 67 y.o. female past medical history of COPD, hyperlipidemia, hypertension presents today for evaluation of chest pain and abdominal pain.  Patient reports acute onset of chest pain and abdominal pain around 3 AM today.  States she felt like somebody sitting on her chest.  Abdominal pain is mostly around the epigastrium and lower abdominal, cramping sensation, states she also felt some back pain.  She endorses nausea and vomiting.  Denies any blood in her emesis.  Denies constipation or diarrhea, urinary symptoms, blood in his stool or urine.  Denies any abdominal surgery in the past.  She states that she has similar pain about 6 months ago, CT scan back then was significant for right renal cystic lesion and 3 cm abdominal aortic aneurysm. Denies any fever.  States she smokes 3 cigarettes a day and occasional drinking.   Abdominal Pain     Past Medical History:  Diagnosis Date   COPD (chronic obstructive pulmonary disease) (HCC)    High cholesterol    Hypertension    History reviewed. No pertinent surgical history.   Home Medications Prior to Admission medications   Medication Sig Start Date End Date Taking? Authorizing Provider  ondansetron (ZOFRAN-ODT) 4 MG disintegrating tablet Take 1 tablet (4 mg total) by mouth every 8 (eight) hours as needed. 12/21/22  Yes Jeanelle Malling, PA  oxyCODONE-acetaminophen (PERCOCET/ROXICET) 5-325 MG tablet Take 1 tablet by mouth every 6 (six) hours as needed for severe pain. 12/21/22  Yes Jeanelle Malling, PA  albuterol (VENTOLIN HFA) 108 (90 Base) MCG/ACT inhaler Inhale 2 puffs into the lungs every 6 (six) hours as needed for shortness of breath. 02/24/22   Charlott Holler, MD  buPROPion (WELLBUTRIN XL) 150 MG 24 hr tablet 1 tablet in the morning  10/05/20   [provider]  dicyclomine (BENTYL) 20 MG tablet Take 1 tablet (20 mg total) by mouth 2 (two) times daily. 03/17/22   Blue, Soijett A, PA-C  nicotine (NICODERM CQ - DOSED IN MG/24 HOURS) 14 mg/24hr patch 1 patch to skin 10/05/20   [provider]  nicotine (NICODERM CQ - DOSED IN MG/24 HR) 7 mg/24hr patch 7 mg daily. 12/17/20   [provider]  ondansetron (ZOFRAN) 4 MG tablet Take 1 tablet (4 mg total) by mouth every 6 (six) hours. 03/17/22   Blue, Soijett A, PA-C  oxymetazoline (MUCINEX SINUS-MAX SINUS/ALLRGY) 0.05 % nasal spray Place 1 spray into both nostrils 2 (two) times daily.    [provider]  rosuvastatin (CRESTOR) 5 MG tablet Take 5 mg by mouth daily. 10/13/20   [provider]  umeclidinium-vilanterol (ANORO ELLIPTA) 62.5-25 MCG/ACT AEPB Inhale 1 puff into the lungs daily. 02/24/22   Charlott Holler, MD  umeclidinium-vilanterol [facility] ELLIPTA) 62.5-25 MCG/ACT AEPB Inhale 1 puff into the lungs daily. 04/29/22   Charlott Holler, MD      Allergies    Patient has no known allergies.    Review of Systems   Review of Systems  Gastrointestinal:  Positive for abdominal pain.    Physical Exam Updated Vital Signs BP (!) 158/88   Pulse 67   Temp 98.3 F (36.8 C)   Resp 18   Ht 5\' 1"  (1.549 m)   Wt 58.5 kg  SpO2 94%   BMI 24.37 kg/m  Physical Exam Vitals and nursing note reviewed.  Constitutional:      Appearance: Normal appearance.  HENT:     Head: Normocephalic and atraumatic.     Mouth/Throat:     Mouth: Mucous membranes are moist.  Eyes:     General: No scleral icterus. Cardiovascular:     Rate and Rhythm: Normal rate and regular rhythm.     Pulses: Normal pulses.     Heart sounds: Normal heart sounds.  Pulmonary:     Effort: Pulmonary effort is normal.     Breath sounds: Normal breath sounds.  Abdominal:     General: Abdomen is flat.     Palpations: Abdomen is soft.     Tenderness: There is abdominal tenderness  in the right lower quadrant, epigastric area, suprapubic area and left lower quadrant.  Musculoskeletal:        General: No deformity.  Skin:    General: Skin is warm.     Findings: No rash.  Neurological:     General: No focal deficit present.     Mental Status: She is alert.  Psychiatric:        Mood and Affect: Mood normal.     ED Results / Procedures / Treatments   Labs (all labs ordered are listed, but only abnormal results are displayed) Labs Reviewed  LIPASE, BLOOD - Abnormal; Notable for the following components:      Result Value   Lipase 10 (*)    All other components within normal limits  COMPREHENSIVE METABOLIC PANEL - Abnormal; Notable for the following components:   Glucose, Bld 106 (*)    All other components within normal limits  URINALYSIS, ROUTINE W REFLEX MICROSCOPIC - Abnormal; Notable for the following components:   Color, Urine COLORLESS (*)    Specific Gravity, Urine 1.036 (*)    All other components within normal limits  CBC  TROPONIN I (HIGH SENSITIVITY)  TROPONIN I (HIGH SENSITIVITY)    EKG EKG Interpretation  Date/Time:  Wednesday Dec 21 2022 16:27:51 EDT Ventricular Rate:  69 PR Interval:  148 QRS Duration: 89 QT Interval:  395 QTC Calculation: 424 R Axis:   74 Text Interpretation: Sinus rhythm Consider right atrial enlargement Confirmed by Ernie Avena ([id number]) on 12/21/2022 4:32:49 PM  Radiology CT ABDOMEN PELVIS W CONTRAST  Result Date: 12/21/2022 CLINICAL DATA:  Abdominal pain EXAM: CT ABDOMEN AND PELVIS WITH CONTRAST TECHNIQUE: Multidetector CT imaging of the abdomen and pelvis was performed using the standard protocol following bolus administration of intravenous contrast. RADIATION DOSE REDUCTION: This exam was performed according to the departmental dose-optimization program which includes automated exposure control, adjustment of the mA and/or kV according to patient size and/or use of iterative reconstruction technique. CONTRAST:   75mL OMNIPAQUE IOHEXOL 300 MG/ML  SOLN COMPARISON:  03/17/2022 FINDINGS: Lower chest: Visualized lower lung fields are clear. Hepatobiliary: There is a 8.6 mm fluid density structure in the dome of liver. There is another 5 mm low-density lesion in liver close to the falciform ligament. These appear stable. There is no dilation of bile ducts. Gallbladder is unremarkable. Pancreas: There are few small calcifications in the head of the pancreas, possibly from chronic inflammation. Spleen: Unremarkable. Adrenals/Urinary Tract: Adrenals are unremarkable. There is no hydronephrosis. There is a 1.6 x 1.4 cm low-density lesion in the lateral margin of right kidney with possible thin incomplete internal septation. This finding has not changed. There are no renal or ureteral stones. Urinary  bladder is unremarkable. Stomach/Bowel: Stomach is unremarkable. Small bowel loops are not dilated. Appendix is not seen. There is no pericecal inflammation. There is no wall thickening in colon. There is no pericolic stranding. Vascular/Lymphatic: Atherosclerotic plaques and calcifications are seen in the aorta. There is ectasia of proximal course of infrarenal aorta measuring up to 2.9 cm. Infrarenal aorta close to the bifurcation measures 2.8 cm in diameter. These findings have not changed significantly. Reproductive: Unremarkable the Other: There is no ascites or pneumoperitoneum. Musculoskeletal: There is first-degree anterolisthesis at L4-L5 level. There is minimal retrolisthesis at L1-L2 level. These findings have not changed significantly. Spinal stenosis and encroachment of neural foramina is seen at the L4-L5 level. IMPRESSION: There is no evidence of intestinal obstruction or pneumoperitoneum. There is no hydronephrosis. There is 1.6 cm cyst with thin incomplete internal septation in right kidney. Follow-up CT in 12 months may be considered to assess stability of this finding. Possible small hepatic cysts. Small coarse  calcifications in head of the pancreas may be from chronic inflammation. Lumbar spondylosis. Atherosclerotic changes are noted in infrarenal aorta with atherosclerotic plaques and calcifications. There is focal ectasia of infrarenal aorta measuring up to 2.9 cm in diameter. This finding has not changed significantly since 03/17/2022. Electronically Signed   By: Ernie Avena M.D.   On: 12/21/2022 19:06   DG Chest 2 View  Result Date: 12/21/2022 CLINICAL DATA:  Chest pain. EXAM: CHEST - 2 VIEW COMPARISON:  March 17, 2022. FINDINGS: The heart size and mediastinal contours are within normal limits. Stable calcified granuloma noted in right mid lung. No acute abnormality is noted. The visualized skeletal structures are unremarkable. IMPRESSION: No active cardiopulmonary disease. Electronically Signed   By: Lupita Raider M.D.   On: 12/21/2022 17:43    Procedures Procedures    Medications Ordered in ED Medications  ondansetron (ZOFRAN) injection 4 mg (4 mg Intravenous Given 12/21/22 1702)  morphine (PF) 4 MG/ML injection 4 mg (4 mg Intravenous Given 12/21/22 1703)  iohexol (OMNIPAQUE) 300 MG/ML solution 100 mL (75 mLs Intravenous Contrast Given 12/21/22 1751)    ED Course/ Medical Decision Making/ A&P                             Medical Decision Making Amount and/or Complexity of Data Reviewed Labs: ordered. Radiology: ordered.  Risk Prescription drug management.   This patient presents to the ED for abdominal pain, this involves an extensive number of treatment options, and is a complaint that carries with a high risk of complications and morbidity.  The differential diagnosis includes ACS/MI, pneumonia, pneumothorax, pericarditis, gastroenteritis, renal cyst, kidney stones, SBO, appendicitis, diverticulosis, diverticulosis, ovarian cyst, hepatic cyst, infectious etiology.  This is not an exhaustive list.  Lab tests: I ordered and personally interpreted labs.  The pertinent results  include: WBC unremarkable. Hbg unremarkable. Platelets unremarkable. Electrolytes unremarkable. BUN, creatinine unremarkable.  Troponin negative.  Urinalysis unremarkable.  Imaging studies: I ordered imaging studies. I personally reviewed, interpreted imaging and agree with the radiologist's interpretations. The results include: CT abdomen pelvis today show a right renal cyst and a 2.9 cm abdominal aortic aneurysm.  Problem list/ ED course/ Critical interventions/ Medical management: HPI: See above Vital signs within normal range and stable throughout visit. Laboratory/imaging studies significant for: See above. On physical examination, patient is afebrile and appears in no acute distress.  Patient presents with a chief complaint of chest pain, epigastrium and lower abdominal pain.  There was tenderness to palpation to the epigastrium and right lower quadrant as well as left lower quadrant.  EKG without signs of active ischemia.  Given the timing of pain to the ER presentation delta troponin was negative so doubt NSTEMI.  Chest x-ray show no evidence of pneumonia, pneumothorax or pleural effusions.  Presentation most consistent with asthma or COPD.  Patient has clear lung sounds on my examinations. Unlikely PE given low Wells score, patient has no tachycardia, tachypnea or hypoxia.  CT scan of the abdomen and pelvis today showed a right renal cyst and a 2.9 cm abdominal aneurysm which is stable from her last CT scan 6 months ago.  Based on CT scan results I have low suspicions for SBO, appendicitis, kidney stone, diverticulitis, diverticulosis.  Urinalysis unremarkable.  Unlikely UTI.  CT scan result discussed in length with the patient. Given morphine, Zofran in the emergency department.  Reevaluation of patient after this medication showed that the patient improved.  I advised patient to take Tylenol/ibuprofen or Percocet as needed for pain, follow-up with PCP and gastroenterology for further evaluation  and management.  Strict return precaution discussed. I have reviewed the patient home medicines and have made adjustments as needed.  Cardiac monitoring/EKG: The patient was maintained on a cardiac monitor.  I personally reviewed and interpreted the cardiac monitor which showed an underlying rhythm of: sinus rhythm.  Additional history obtained: External records from outside source obtained and reviewed including: Chart review including previous notes, labs, imaging.  Consultations obtained:  Disposition Continued outpatient therapy. Follow-up with PCP and gastroenterology recommended for reevaluation of symptoms. Treatment plan discussed with patient.  Pt acknowledged understanding was agreeable to the plan. Worrisome signs and symptoms were discussed with patient, and patient acknowledged understanding to return to the ED if they noticed these signs and symptoms. Patient was stable upon discharge.   This chart was dictated using voice recognition software.  Despite best efforts to proofread,  errors can occur which can change the documentation meaning.          Final Clinical Impression(s) / ED Diagnoses Final diagnoses:  Generalized abdominal pain    Rx / DC Orders ED Discharge Orders          Ordered    oxyCODONE-acetaminophen (PERCOCET/ROXICET) 5-325 MG tablet  Every 6 hours PRN        12/21/22 1942    ondansetron (ZOFRAN-ODT) 4 MG disintegrating tablet  Every 8 hours PRN        12/21/22 1942              Jeanelle Malling, PA 12/22/22 1440    Ernie Avena, MD 12/24/22 [phone]

## 2022-12-23 DIAGNOSIS — E7800 Pure hypercholesterolemia, unspecified: Secondary | ICD-10-CM | POA: Diagnosis not present

## 2022-12-23 DIAGNOSIS — R7301 Impaired fasting glucose: Secondary | ICD-10-CM | POA: Diagnosis not present

## 2022-12-23 DIAGNOSIS — R030 Elevated blood-pressure reading, without diagnosis of hypertension: Secondary | ICD-10-CM | POA: Diagnosis not present

## 2022-12-28 DIAGNOSIS — R7303 Prediabetes: Secondary | ICD-10-CM | POA: Diagnosis not present

## 2022-12-28 DIAGNOSIS — Z0000 Encounter for general adult medical examination without abnormal findings: Secondary | ICD-10-CM | POA: Diagnosis not present

## 2022-12-28 DIAGNOSIS — Z720 Tobacco use: Secondary | ICD-10-CM | POA: Diagnosis not present

## 2022-12-28 DIAGNOSIS — Z1211 Encounter for screening for malignant neoplasm of colon: Secondary | ICD-10-CM | POA: Diagnosis not present

## 2022-12-28 DIAGNOSIS — R030 Elevated blood-pressure reading, without diagnosis of hypertension: Secondary | ICD-10-CM | POA: Diagnosis not present

## 2022-12-28 DIAGNOSIS — R109 Unspecified abdominal pain: Secondary | ICD-10-CM | POA: Diagnosis not present

## 2022-12-28 DIAGNOSIS — J449 Chronic obstructive pulmonary disease, unspecified: Secondary | ICD-10-CM | POA: Diagnosis not present

## 2022-12-28 DIAGNOSIS — E7800 Pure hypercholesterolemia, unspecified: Secondary | ICD-10-CM | POA: Diagnosis not present

## 2022-12-28 DIAGNOSIS — E2839 Other primary ovarian failure: Secondary | ICD-10-CM | POA: Diagnosis not present

## 2022-12-29 ENCOUNTER — Other Ambulatory Visit: Payer: Self-pay | Admitting: Family Medicine

## 2022-12-29 DIAGNOSIS — Z1231 Encounter for screening mammogram for malignant neoplasm of breast: Secondary | ICD-10-CM

## 2023-01-03 DIAGNOSIS — J441 Chronic obstructive pulmonary disease with (acute) exacerbation: Secondary | ICD-10-CM | POA: Diagnosis not present

## 2023-01-13 ENCOUNTER — Ambulatory Visit
Admission: RE | Admit: 2023-01-13 | Discharge: 2023-01-13 | Disposition: A | Discharge status: Home / Self Care | Payer: Medicare Other | Source: Ambulatory Visit | Attending: Family Medicine | Admitting: Family Medicine

## 2023-01-13 DIAGNOSIS — Z122 Encounter for screening for malignant neoplasm of respiratory organs: Secondary | ICD-10-CM

## 2023-01-13 DIAGNOSIS — Z87891 Personal history of nicotine dependence: Secondary | ICD-10-CM

## 2023-01-13 DIAGNOSIS — I700 Atherosclerosis of aorta: Secondary | ICD-10-CM | POA: Diagnosis not present

## 2023-01-13 DIAGNOSIS — F17210 Nicotine dependence, cigarettes, uncomplicated: Secondary | ICD-10-CM | POA: Diagnosis not present

## 2023-01-13 DIAGNOSIS — J439 Emphysema, unspecified: Secondary | ICD-10-CM | POA: Diagnosis not present

## 2023-01-18 ENCOUNTER — Other Ambulatory Visit: Payer: Self-pay

## 2023-01-18 DIAGNOSIS — Z87891 Personal history of nicotine dependence: Secondary | ICD-10-CM

## 2023-01-18 DIAGNOSIS — F17210 Nicotine dependence, cigarettes, uncomplicated: Secondary | ICD-10-CM

## 2023-01-18 DIAGNOSIS — Z122 Encounter for screening for malignant neoplasm of respiratory organs: Secondary | ICD-10-CM

## 2023-02-28 DIAGNOSIS — R109 Unspecified abdominal pain: Secondary | ICD-10-CM | POA: Diagnosis not present

## 2023-03-22 ENCOUNTER — Ambulatory Visit: Payer: Medicare Other | Admitting: Internal Medicine

## 2023-03-22 ENCOUNTER — Encounter: Payer: Self-pay | Admitting: Internal Medicine

## 2023-03-22 VITALS — BP 120/70 | HR 71 | Ht 62.0 in | Wt 126.2 lb

## 2023-03-22 DIAGNOSIS — J4489 Other specified chronic obstructive pulmonary disease: Secondary | ICD-10-CM | POA: Diagnosis not present

## 2023-03-22 DIAGNOSIS — J439 Emphysema, unspecified: Secondary | ICD-10-CM

## 2023-03-22 DIAGNOSIS — Z716 Tobacco abuse counseling: Secondary | ICD-10-CM

## 2023-03-22 MED ORDER — ALBUTEROL SULFATE HFA 108 (90 BASE) MCG/ACT IN AERS
2.0000 | INHALATION_SPRAY | Freq: Four times a day (QID) | RESPIRATORY_TRACT | 11 refills | Status: DC | PRN
Start: 2023-03-22 — End: 2024-05-06

## 2023-03-22 MED ORDER — PREDNISONE 20 MG PO TABS
40.0000 mg | ORAL_TABLET | Freq: Every day | ORAL | 0 refills | Status: DC
Start: 2023-03-22 — End: 2024-01-18

## 2023-03-22 MED ORDER — TRELEGY ELLIPTA 100-62.5-25 MCG/ACT IN AEPB: 1.0000 | INHALATION_SPRAY | Freq: Every day | RESPIRATORY_TRACT | 5 refills | Status: DC

## 2023-03-22 MED ORDER — VARENICLINE TARTRATE 1 MG PO TABS
1.0000 mg | ORAL_TABLET | Freq: Two times a day (BID) | ORAL | 5 refills | Status: AC
Start: 2023-03-22 — End: ?

## 2023-03-22 MED ORDER — BREZTRI AEROSPHERE 160-9-4.8 MCG/ACT IN AERO
2.0000 | INHALATION_SPRAY | Freq: Two times a day (BID) | RESPIRATORY_TRACT | 11 refills | Status: DC
Start: 2023-03-22 — End: 2023-03-22

## 2023-03-22 NOTE — Patient Instructions (Addendum)
Please schedule follow up scheduled with myself in 6 months.  If my schedule is not open yet, we will contact you with a reminder closer to that time. Please call [phone] if you haven't heard from [place] a month before.   Stop anoro, switch to trelegy 1 puff once daily, gargle after use.  Start chantix for quitting smoking.    Take the albuterol rescue inhaler every 4 to 6 hours as needed for wheezing or shortness of breath. You can also take it 15 minutes before exercise or exertional activity. Side effects include heart racing or pounding, jitters or anxiety. If you have a history of an irregular heart rhythm, it can make this worse. Can also give some patients a hard time sleeping.  When you start feeling sick you should start taking albuterol every 4 hours. If this is not improving your chest congestion, shortness of breath, wheezing, tightness, you should start the prendisone and let us know.   Varenicline -- Varenicline (brand name: Chantix) is a prescription medication that works in the brain to reduce nicotine withdrawal symptoms and cigarette cravings. In several studies, it was more effective than placebo (a lookalike substitute that contains no medication.)  It should be taken after eating with a full glass of water as follows: ?One 0.5 mg tablet daily for three days ?One 0.5 mg tablet twice daily for the next four days ?One 1.0 mg tablet twice daily starting at day 7  You should plan to quit smoking between one and four weeks after starting varenicline.   You should continue it for 12 weeks before concluding if it is working; if you successfully quit at 12 weeks, you may continue taking it for an additional 12 weeks. If you have not quit after taking varenicline for 12 weeks, talk to your health care provider about the next step. Options include working harder to make behavioral changes and continuing varenicline or switching to another treatment.  Common side effects of  varenicline include nausea and abnormal dreams.  In the very early years of Chantix there were some concerns about Chantix affecting mood and depression.  However, there has been lots of research on this and this is no longer a concern.  Chantix is considered safe to use even in patients taking medication for depression.   In 2011, the [place] Food and Drug Administration (FDA) issued an advisory that, in people who already have heart or blood vessel disease, varenicline may increase the risk of acute heart problems. If there is such a risk, it appears to be small, and is likely outweighed by the benefits of quitting smoking.

## 2023-03-22 NOTE — Progress Notes (Signed)
Anne Montes    [id number]    Feb 12, 1956  Primary Care Physician:Timberlake, Meridee Score, MD Date of Appointment: 03/22/2023 Established Patient Visit  Chief complaint:   Chief Complaint  Patient presents with   Follow-up    Pt states she has no concerns, pt needs refills and would like to discuss a plan incase of flare ups.      HPI: Anne Montes is a 67 y.o. woman with COPD and tobacco use disorder.   Interval Updates: Here for annual COPD follow up.  Still smoking 1/2 ppd. She wants chantix because she did well with this previously.   Still on anoro once daily. Uses albuterol inhaler once in the morning and again in the evenings - usually with heat and humidity.   Over the past year has had 2 flares for her COPD. Exacerbations preceded by low grade fever, headache, fatigued, breathless. Overall worsening breathlessness.   Stopped cetirizine and is now taking oxymetazoline spray for her chronic rhinitis. Has been on this for several months. She does have high blood pressure. She took prednisone from a client and her symptoms went away.   Has been following with lung cancer screening program and last scan was June 2023  I have reviewed the patient's family social and past medical history and updated as appropriate.   Past Medical History:  Diagnosis Date   COPD (chronic obstructive pulmonary disease) (HCC)    High cholesterol    Hypertension     History reviewed. No pertinent surgical history.  Family History  Problem Relation Age of Onset   Lung cancer Mother        died at 79   Cancer Father        oral cancer   Emphysema Maternal Grandmother    Emphysema Paternal Grandmother     Social History   Occupational History   Not on file  Tobacco Use   Smoking status: Every Day    Current packs/day: 1.50    Average packs/day: 1.5 packs/day for 52.4 years (78.6 ttl pk-yrs)    Types: Cigarettes    Start date: 10/31/1970    Passive exposure:  Current   Smokeless tobacco: Never   Tobacco comments:    down to 5 cig/day-01/04/21  Vaping Use   Vaping status: Never Used  Substance and Sexual Activity   Alcohol use: Yes   Drug use: Yes    Types: Marijuana   Sexual activity: Yes     Physical Exam: Blood pressure 120/70, pulse 71, height 5\' 2"  (1.575 m), weight 126 lb 3.2 oz (57.2 kg), SpO2 98%.  Gen:      No acute distress Lungs:    ctab no wheezes or crackles CV:        RRR no mrg, no edema   Data Reviewed: Imaging: CT Chest June 2024 reviewed - lung rads 1 without any nodules or masses concerning for lung cancer. Moderate emphysema.   PFTs: I have personally reviewed the patient's PFTs obtained 12/22/20 which shows moderate airflow limitation without BD response, hyperinflation and air trapping normal diffusion capacity.   Labs:  Immunization status: Immunization History  Administered Date(s) Administered   Influenza,inj,Quad PF,6+ Mos 08/02/2021   PFIZER(Purple Top)SARS-COV-2 Vaccination 11/01/2019, 11/26/2019, 07/12/2020, 12/26/2020   PNEUMOCOCCAL CONJUGATE-20 12/17/2020   Pneumococcal Polysaccharide-23 01/18/2016   Tdap 01/18/2016    Assessment:  COPD/emphysema, moderate FEV 1 68% of predicted, with progression (2+ flares in the last year) Tobacco use disorder Need  for lung cancer screening.   Plan/Recommendations:  Stop anoro, switch to trelegy 1 puff once daily, gargle after use.   Start chantix for quitting smoking.   Take the albuterol rescue inhaler every 4 to 6 hours as needed for wheezing or shortness of breath.  Follow up with LDCT screening program. Next due for scan June 2025.   COPD action plan discussed - prednisone on hand.   Smoking Cessation Counseling:  1. The patient is an everyday smoker and symptomatic due to the following condition copd 2. The patient is currently contemplative in quitting smoking. 3. I advised patient to quit smoking. 4. We identified patient specific  barriers to change.  5. I personally spent 3  minutes counseling the patient regarding tobacco use disorder. 6. We discussed management of stress and anxiety to help with smoking cessation, when applicable. 7. We discussed nicotine replacement therapy, Wellbutrin, Chantix as possible options. 8. I advised setting a quit date. 9. Follow?up arranged with our office to continue ongoing discussions. 10.Resources given to patient including quit hotline.   Return to Care: Return in about 6 months (around 09/22/2023).   Durel Salts, MD Pulmonary and Critical Care Medicine [facility] Office:[phone]

## 2023-04-07 DIAGNOSIS — K2289 Other specified disease of esophagus: Secondary | ICD-10-CM | POA: Diagnosis not present

## 2023-04-07 DIAGNOSIS — K259 Gastric ulcer, unspecified as acute or chronic, without hemorrhage or perforation: Secondary | ICD-10-CM | POA: Diagnosis not present

## 2023-04-07 DIAGNOSIS — K209 Esophagitis, unspecified without bleeding: Secondary | ICD-10-CM | POA: Diagnosis not present

## 2023-04-07 DIAGNOSIS — R1013 Epigastric pain: Secondary | ICD-10-CM | POA: Diagnosis not present

## 2023-04-07 DIAGNOSIS — K2930 Chronic superficial gastritis without bleeding: Secondary | ICD-10-CM | POA: Diagnosis not present

## 2023-04-07 DIAGNOSIS — Z1211 Encounter for screening for malignant neoplasm of colon: Secondary | ICD-10-CM | POA: Diagnosis not present

## 2023-04-07 DIAGNOSIS — K2970 Gastritis, unspecified, without bleeding: Secondary | ICD-10-CM | POA: Diagnosis not present

## 2023-04-17 ENCOUNTER — Ambulatory Visit
Admission: RE | Admit: 2023-04-17 | Discharge: 2023-04-17 | Disposition: A | Discharge status: Home / Self Care | Payer: Medicare Other | Source: Ambulatory Visit | Attending: Family Medicine

## 2023-04-17 DIAGNOSIS — Z1231 Encounter for screening mammogram for malignant neoplasm of breast: Secondary | ICD-10-CM | POA: Diagnosis not present

## 2023-04-18 DIAGNOSIS — K2930 Chronic superficial gastritis without bleeding: Secondary | ICD-10-CM | POA: Diagnosis not present

## 2023-05-02 DIAGNOSIS — R142 Eructation: Secondary | ICD-10-CM | POA: Diagnosis not present

## 2023-05-02 DIAGNOSIS — K219 Gastro-esophageal reflux disease without esophagitis: Secondary | ICD-10-CM | POA: Diagnosis not present

## 2023-05-02 DIAGNOSIS — R109 Unspecified abdominal pain: Secondary | ICD-10-CM | POA: Diagnosis not present

## 2023-05-22 ENCOUNTER — Encounter: Payer: Self-pay | Admitting: Internal Medicine

## 2023-06-02 DIAGNOSIS — H5201 Hypermetropia, right eye: Secondary | ICD-10-CM | POA: Diagnosis not present

## 2023-06-02 DIAGNOSIS — H524 Presbyopia: Secondary | ICD-10-CM | POA: Diagnosis not present

## 2023-06-30 DIAGNOSIS — F17210 Nicotine dependence, cigarettes, uncomplicated: Secondary | ICD-10-CM | POA: Diagnosis not present

## 2023-06-30 DIAGNOSIS — E7800 Pure hypercholesterolemia, unspecified: Secondary | ICD-10-CM | POA: Diagnosis not present

## 2023-06-30 DIAGNOSIS — J449 Chronic obstructive pulmonary disease, unspecified: Secondary | ICD-10-CM | POA: Diagnosis not present

## 2023-06-30 DIAGNOSIS — I7143 Infrarenal abdominal aortic aneurysm, without rupture: Secondary | ICD-10-CM | POA: Diagnosis not present

## 2023-06-30 DIAGNOSIS — I10 Essential (primary) hypertension: Secondary | ICD-10-CM | POA: Diagnosis not present

## 2023-06-30 DIAGNOSIS — I2510 Atherosclerotic heart disease of native coronary artery without angina pectoris: Secondary | ICD-10-CM | POA: Diagnosis not present

## 2023-07-11 DIAGNOSIS — Z20822 Contact with and (suspected) exposure to covid-19: Secondary | ICD-10-CM | POA: Diagnosis not present

## 2023-07-11 DIAGNOSIS — J449 Chronic obstructive pulmonary disease, unspecified: Secondary | ICD-10-CM | POA: Diagnosis not present

## 2023-07-11 DIAGNOSIS — R079 Chest pain, unspecified: Secondary | ICD-10-CM | POA: Diagnosis not present

## 2023-07-11 DIAGNOSIS — R059 Cough, unspecified: Secondary | ICD-10-CM | POA: Diagnosis not present

## 2023-07-12 ENCOUNTER — Emergency Department (HOSPITAL_BASED_OUTPATIENT_CLINIC_OR_DEPARTMENT_OTHER): Payer: Medicare Other

## 2023-07-12 ENCOUNTER — Encounter (HOSPITAL_BASED_OUTPATIENT_CLINIC_OR_DEPARTMENT_OTHER): Payer: Self-pay | Admitting: Emergency Medicine

## 2023-07-12 ENCOUNTER — Emergency Department (HOSPITAL_BASED_OUTPATIENT_CLINIC_OR_DEPARTMENT_OTHER)
Admission: EM | Admit: 2023-07-12 | Discharge: 2023-07-12 | Disposition: A | Discharge status: Home / Self Care | Payer: Medicare Other | Attending: Emergency Medicine | Admitting: Emergency Medicine

## 2023-07-12 ENCOUNTER — Other Ambulatory Visit: Payer: Self-pay

## 2023-07-12 DIAGNOSIS — D72829 Elevated white blood cell count, unspecified: Secondary | ICD-10-CM | POA: Insufficient documentation

## 2023-07-12 DIAGNOSIS — I700 Atherosclerosis of aorta: Secondary | ICD-10-CM | POA: Diagnosis not present

## 2023-07-12 DIAGNOSIS — J449 Chronic obstructive pulmonary disease, unspecified: Secondary | ICD-10-CM | POA: Insufficient documentation

## 2023-07-12 DIAGNOSIS — R0602 Shortness of breath: Secondary | ICD-10-CM | POA: Diagnosis not present

## 2023-07-12 DIAGNOSIS — R7989 Other specified abnormal findings of blood chemistry: Secondary | ICD-10-CM | POA: Diagnosis not present

## 2023-07-12 DIAGNOSIS — Z7984 Long term (current) use of oral hypoglycemic drugs: Secondary | ICD-10-CM | POA: Diagnosis not present

## 2023-07-12 DIAGNOSIS — I10 Essential (primary) hypertension: Secondary | ICD-10-CM | POA: Insufficient documentation

## 2023-07-12 DIAGNOSIS — I2510 Atherosclerotic heart disease of native coronary artery without angina pectoris: Secondary | ICD-10-CM | POA: Diagnosis not present

## 2023-07-12 DIAGNOSIS — Z7951 Long term (current) use of inhaled steroids: Secondary | ICD-10-CM | POA: Diagnosis not present

## 2023-07-12 DIAGNOSIS — J432 Centrilobular emphysema: Secondary | ICD-10-CM | POA: Diagnosis not present

## 2023-07-12 LAB — BASIC METABOLIC PANEL
Anion gap: 9 (ref 5–15)
BUN: 21 mg/dL (ref 8–23)
CO2: 24 mmol/L (ref 22–32)
Calcium: 9.1 mg/dL (ref 8.9–10.3)
Chloride: 106 mmol/L (ref 98–111)
Creatinine, Ser: 1.01 mg/dL — ABNORMAL HIGH (ref 0.44–1.00)
GFR, Estimated: >60 mL/min (ref >60)
Glucose, Bld: 148 mg/dL — ABNORMAL HIGH (ref 70–99)
Potassium: 4.1 mmol/L (ref 3.5–5.1)
Sodium: 139 mmol/L (ref 135–145)

## 2023-07-12 LAB — CBC
HCT: 37.7 % (ref 36.0–46.0)
Hemoglobin: 12.7 g/dL (ref 12.0–15.0)
MCH: 32.6 pg (ref 26.0–34.0)
MCHC: 33.7 g/dL (ref 30.0–36.0)
MCV: 96.9 fL (ref 80.0–100.0)
Platelets: 293 10*3/uL (ref 150–400)
RBC: 3.89 MIL/uL (ref 3.87–5.11)
RDW: 12.5 % (ref 11.5–15.5)
WBC: 13.3 10*3/uL — ABNORMAL HIGH (ref 4.0–10.5)
nRBC: 0 % (ref 0.0–0.2)

## 2023-07-12 LAB — TROPONIN I (HIGH SENSITIVITY): Troponin I (High Sensitivity): 3 ng/L (ref <18)

## 2023-07-12 MED ORDER — IOHEXOL 350 MG/ML SOLN
100.0000 mL | Freq: Once | INTRAVENOUS | Status: AC | PRN
  Administered 2023-07-12: 60 mL via INTRAVENOUS

## 2023-07-12 NOTE — ED Provider Notes (Signed)
Patient given in sign out by Prosperi, PA-C.  Please review their note for patient HPI, physical exam, workup.  At this time the plan is to follow-up on CT scan and labs and imaging and dispo accordingly.  Troponin negative along with CT scan.  Will discharge with primary care follow-up.  At time of discharge patient was stable to be discharged.  Patient verbalized understanding symptoms of this plan.  Patient given strict return precautions.   Remi Deter 07/12/23 2025    Vanetta Mulders, MD 07/14/23 2137

## 2023-07-12 NOTE — Discharge Instructions (Signed)
Please follow-up with your primary care provider in regards recent ER visit.  Today your labs and imaging show you do not have a blood clot your shortness of breath is most likely that your COPD exacerbation so please continue taking your steroids and antibiotics.  If symptoms change or worsen please return to the ER.

## 2023-07-12 NOTE — ED Provider Notes (Signed)
[place] EMERGENCY DEPARTMENT AT [facility] Provider Note   CSN: [id number] Arrival date & time: 07/12/23  1654     History  Chief Complaint  Patient presents with   Shortness of Breath    Anne Montes is a 67 y.o. female with past medical history seen for hypertension, hyperlipidemia, COPD who presents with concern for shortness of breath, seen by PCP yesterday for COPD flareup, started on antibiotic and prednisone.  Patient reports that she has begun taking these medications.  D-dimer reported to be elevated on call back to patient, 2.69.  No previous history of PE, she does not take any blood thinners.   Shortness of Breath      Home Medications Prior to Admission medications   Medication Sig Start Date End Date Taking? Authorizing Provider  albuterol (VENTOLIN HFA) 108 (90 Base) MCG/ACT inhaler Inhale 2 puffs into the lungs every 6 (six) hours as needed for shortness of breath. 03/22/23   Charlott Holler, MD  buPROPion (WELLBUTRIN XL) 150 MG 24 hr tablet 1 tablet in the morning 10/05/20   [provider]  dicyclomine (BENTYL) 20 MG tablet Take 1 tablet (20 mg total) by mouth 2 (two) times daily. 03/17/22   Blue, Soijett A, PA-C  Fluticasone-Umeclidin-Vilant (TRELEGY ELLIPTA) 100-62.5-25 MCG/ACT AEPB Inhale 1 puff into the lungs daily at 6 (six) AM. 03/22/23   Charlott Holler, MD  nicotine (NICODERM CQ - DOSED IN MG/24 HOURS) 14 mg/24hr patch 1 patch to skin 10/05/20   [provider]  nicotine (NICODERM CQ - DOSED IN MG/24 HR) 7 mg/24hr patch 7 mg daily. 12/17/20   [provider]  ondansetron (ZOFRAN) 4 MG tablet Take 1 tablet (4 mg total) by mouth every 6 (six) hours. 03/17/22   Blue, Soijett A, PA-C  ondansetron (ZOFRAN-ODT) 4 MG disintegrating tablet Take 1 tablet (4 mg total) by mouth every 8 (eight) hours as needed. 12/21/22   Jeanelle Malling, PA  oxyCODONE-acetaminophen (PERCOCET/ROXICET) 5-325 MG tablet Take 1 tablet by mouth every 6 (six)  hours as needed for severe pain. 12/21/22   Jeanelle Malling, PA  oxymetazoline (MUCINEX SINUS-MAX SINUS/ALLRGY) 0.05 % nasal spray Place 1 spray into both nostrils 2 (two) times daily.    [provider]  predniSONE (DELTASONE) 20 MG tablet Take 2 tablets (40 mg total) by mouth daily with breakfast. 03/22/23   Charlott Holler, MD  rosuvastatin (CRESTOR) 5 MG tablet Take 5 mg by mouth daily. 10/13/20   [provider]  varenicline (CHANTIX CONTINUING MONTH PAK) 1 MG tablet Take 1 tablet (1 mg total) by mouth 2 (two) times daily. 03/22/23   Charlott Holler, MD      Allergies    Patient has no known allergies.    Review of Systems   Review of Systems  Respiratory:  Positive for shortness of breath.   All other systems reviewed and are negative.   Physical Exam Updated Vital Signs BP (!) 155/89 (BP Location: Right Arm)   Pulse 78   Temp 98.1 F (36.7 C)   Resp 16   Ht 5\' 2"  (1.575 m)   Wt 55.3 kg   SpO2 97%   BMI 22.31 kg/m  Physical Exam Vitals and nursing note reviewed.  Constitutional:      General: She is not in acute distress.    Appearance: Normal appearance.  HENT:     Head: Normocephalic and atraumatic.  Eyes:     General:  Right eye: No discharge.        Left eye: No discharge.  Cardiovascular:     Rate and Rhythm: Normal rate and regular rhythm.     Heart sounds: No murmur heard.    No friction rub. No gallop.  Pulmonary:     Effort: Pulmonary effort is normal.     Breath sounds: Normal breath sounds.  Abdominal:     General: Bowel sounds are normal.     Palpations: Abdomen is soft.  Skin:    General: Skin is warm and dry.     Capillary Refill: Capillary refill takes less than 2 seconds.  Neurological:     Mental Status: She is alert and oriented to person, place, and time.  Psychiatric:        Mood and Affect: Mood normal.        Behavior: Behavior normal.     ED Results / Procedures / Treatments   Labs (all labs ordered are listed, but  only abnormal results are displayed) Labs Reviewed  CBC - Abnormal; Notable for the following components:      Result Value   WBC 13.3 (*)    All other components within normal limits  BASIC METABOLIC PANEL - Abnormal; Notable for the following components:   Glucose, Bld 148 (*)    Creatinine, Ser 1.01 (*)    All other components within normal limits  TROPONIN I (HIGH SENSITIVITY)    EKG None  Radiology No results found.  Procedures Procedures    Medications Ordered in ED Medications  iohexol (OMNIPAQUE) 350 MG/ML injection 100 mL (60 mLs Intravenous Contrast Given 07/12/23 1827)    ED Course/ Medical Decision Making/ A&P                                 Medical Decision Making Amount and/or Complexity of Data Reviewed Labs: ordered. Radiology: ordered.   This patient is a 67 y.o. female  who presents to the ED for concern of shob, elevated d dimer.   Differential diagnoses prior to evaluation: The emergent differential diagnosis includes, but is not limited to,  asthma exacerbation, COPD exacerbation, acute upper respiratory infection, acute bronchitis, chronic bronchitis, interstitial lung disease, ARDS, PE, pneumonia, atypical ACS, carbon monoxide poisoning, spontaneous pneumothorax, new CHF vs CHF exacerbation, versus other . This is not an exhaustive differential.   Past Medical History / Co-morbidities / Social History: COPD, hypertension, hyperlipidemia  Additional history: Chart reviewed. Pertinent results include: Reviewed urgent care evaluation from yesterday, patient symptoms that time did seem somewhat consistent with COPD exacerbation, her D-dimer was elevated at 2.69.  Physical Exam: Physical exam performed. The pertinent findings include: No wheezing, rhonchi, stridor, rales on exam, she is somewhat hypertensive, blood pressure 155/89, she is stable oxygen saturation on room air no tachycardia.  Lab Tests/Imaging studies: I personally interpreted  labs/imaging and the pertinent results include: CBC notable for leukocytosis, patient was discharged on steroids, unclear etiology of leukocytosis, may be secondary to bacterial infection related to her COPD exacerbation, could be from steroids alone.  BMP overall unremarkable.  I independently interpreted CT angio PE study which is pending at this time.  Cardiac monitoring: EKG obtained and interpreted by myself and attending physician which shows: Normal sinus rhythm   6:30 PM Care of Shantay Eighmey transferred to [facility] and Dr. Deretha Emory at the end of my shift as the patient will require reassessment once  labs/imaging have resulted. Patient presentation, ED course, and plan of care discussed with review of all pertinent labs and imaging. Please see his/her note for further details regarding further ED course and disposition. Plan at time of handoff is CT for PE, reassess. This may be altered or completely changed at the discretion of the oncoming team pending results of further workup.  Final Clinical Impression(s) / ED Diagnoses Final diagnoses:  None    Rx / DC Orders ED Discharge Orders     None         [place] 07/12/23 1830    Vanetta Mulders, MD 07/14/23 2135

## 2023-07-12 NOTE — ED Triage Notes (Signed)
NAD. Ambulatory. COPD flare up seen by PCP yesterday. Started on antibiotic and prednisone. D-dimer reported to be elevated via call-back today- 2.69 mg/L FEU.

## 2023-07-25 DIAGNOSIS — J209 Acute bronchitis, unspecified: Secondary | ICD-10-CM | POA: Diagnosis not present

## 2023-08-30 DIAGNOSIS — E785 Hyperlipidemia, unspecified: Secondary | ICD-10-CM | POA: Diagnosis not present

## 2023-09-11 ENCOUNTER — Ambulatory Visit: Payer: Medicare Other | Admitting: Internal Medicine

## 2023-09-11 ENCOUNTER — Encounter: Payer: Self-pay | Admitting: Internal Medicine

## 2023-09-11 VITALS — BP 126/83 | HR 73 | Ht 62.0 in | Wt 121.0 lb

## 2023-09-11 DIAGNOSIS — F17200 Nicotine dependence, unspecified, uncomplicated: Secondary | ICD-10-CM | POA: Diagnosis not present

## 2023-09-11 DIAGNOSIS — J310 Chronic rhinitis: Secondary | ICD-10-CM

## 2023-09-11 DIAGNOSIS — J4489 Other specified chronic obstructive pulmonary disease: Secondary | ICD-10-CM | POA: Diagnosis not present

## 2023-09-11 DIAGNOSIS — J439 Emphysema, unspecified: Secondary | ICD-10-CM | POA: Diagnosis not present

## 2023-09-11 MED ORDER — ALBUTEROL SULFATE (2.5 MG/3ML) 0.083% IN NEBU: 2.5000 mg | INHALATION_SOLUTION | Freq: Four times a day (QID) | RESPIRATORY_TRACT | 12 refills | Status: AC | PRN

## 2023-09-11 NOTE — Progress Notes (Signed)
The patient has been prescribed the inhaler breztri with spacer. Inhaler technique was demonstrated to patient. The patient subsequently demonstrated correct technique.  

## 2023-09-11 NOTE — Progress Notes (Signed)
Anne Montes    [id number]    1955/08/15  Primary Care Physician:Timberlake, Meridee Score, MD Date of Appointment: 09/11/2023 Established Patient Visit  Chief complaint:   Chief Complaint  Patient presents with   Follow-up    Pt states she has not used her trelegy in a week , she's noticed that "she does not cough as much but is short  of breath " month weeks ago bronch seems like always mucus just stuck      HPI: Anne Montes is a 68 y.o. woman with COPD and tobacco use disorder.   Interval Updates: Here for COPD follow. Stopped taking trelegy inhaler due to coughing. Her cough is improved but she is more short of breath. She never tried breztri. She still has anoro if she needs to go back to this.   She had steroids and antibiotics twice since October for bronchitis.   Also with chronic rhinitis and drainage. Feels globus sensation in throat. Anti-histamines are not helping.   She has cut back on smoking and is down to 3 cigarettes a day. Chantix is helping.  Albuterol helps her breathing and coughing. Takes  it 4 times a day.    I have reviewed the patient's family social and past medical history and updated as appropriate.   Past Medical History:  Diagnosis Date   COPD (chronic obstructive pulmonary disease) (HCC)    High cholesterol    Hypertension     History reviewed. No pertinent surgical history.  Family History  Problem Relation Age of Onset   Lung cancer Mother        died at 75   Cancer Father        oral cancer   Emphysema Maternal Grandmother    Emphysema Paternal Grandmother     Social History   Occupational History   Not on file  Tobacco Use   Smoking status: Every Day    Current packs/day: 1.50    Average packs/day: 1.5 packs/day for 52.9 years (79.3 ttl pk-yrs)    Types: Cigarettes    Start date: 10/31/1970    Passive exposure: Current   Smokeless tobacco: Never   Tobacco comments:    down to 5 cig/day-01/04/21  Vaping  Use   Vaping status: Never Used  Substance and Sexual Activity   Alcohol use: Yes   Drug use: Yes    Types: Marijuana   Sexual activity: Yes     Physical Exam: Blood pressure 126/83, pulse 73, height 5\' 2"  (1.575 m), weight 121 lb (54.9 kg), SpO2 96%.  Gen:    No distress, frequent coughing, nasal congestion Lungs:  ctab no wheeze CV:        RRR no mrg   Data Reviewed: Imaging: CT Chest June 2024 reviewed - lung rads 1 without any nodules or masses concerning for lung cancer. Moderate emphysema.   PFTs: I have personally reviewed the patient's PFTs obtained 12/22/20 which shows moderate airflow limitation without BD response, hyperinflation and air trapping normal diffusion capacity.   Labs: Lab Results  Component Value Date   NA 139 07/12/2023   K 4.1 07/12/2023   CO2 24 07/12/2023   GLUCOSE 148 (H) 07/12/2023   BUN 21 07/12/2023   CREATININE 1.01 (H) 07/12/2023   CALCIUM 9.1 07/12/2023   GFRNONAA >60 07/12/2023   Lab Results  Component Value Date   WBC 13.3 (H) 07/12/2023   HGB 12.7 07/12/2023   HCT 37.7 07/12/2023  MCV 96.9 07/12/2023   PLT 293 07/12/2023    Immunization status: Immunization History  Administered Date(s) Administered   Influenza,inj,Quad PF,6+ Mos 08/02/2021   PFIZER(Purple Top)SARS-COV-2 Vaccination 11/01/2019, 11/26/2019, 07/12/2020, 12/26/2020   PNEUMOCOCCAL CONJUGATE-20 12/17/2020   Pneumococcal Polysaccharide-23 01/18/2016   Tdap 01/18/2016    Assessment:  COPD/emphysema, 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   Plan/Recommendations:  Stop the trelegy. Switch to breztri with spacer.  Sending albuterol nebs to your pharmacy.  Great job cutting back on smoking! Continue the good work and continue chantix.   Take the albuterol rescue inhaler or nebulizer every 4 to 6 hours as needed for wheezing or  shortness of breath. You can also take it 15 minutes before exercise or exertional activity. Side effects include heart racing or pounding, jitters or anxiety. If you have a history of an irregular heart rhythm, it can make this worse. Can also give some patients a hard time sleeping.  Follow up with LDCT screening program. Next due for scan June 2025.   Blood work on your way out for allergies causing nasal drainage. I have placed a referral to ENT.  CBC with diff for eosinophil count  Return to Care: Return in about 6 months (around 03/10/2024).   Durel Salts, MD Pulmonary and Critical Care Medicine [facility] Office:[phone]

## 2023-09-11 NOTE — Patient Instructions (Addendum)
It was a pleasure to see you today!  Please schedule follow up scheduled with myself in 6 months.  If my schedule is not open yet, we will contact you with a reminder closer to that time. Please call [phone] if you haven't heard from [place] a month before, and always call us sooner if issues or concerns arise. You can also send [place] a message through MyChart, but but aware that this is not to be used for urgent issues and it may take up to 5-7 days to receive a reply. Please be aware that you will likely be able to view your results before I have a chance to respond to them. Please give [place] 5 business days to respond to any non-urgent results.    Stop the trelegy. Switch to breztri with spacer.  If this doesn't work well we can always go back to anoro.   Sending albuterol nebs to your pharmacy.  Great job cutting back on smoking! Continue the good work and continue chantix.   Take the albuterol rescue inhaler or nebulizer every 4 to 6 hours as needed for wheezing or shortness of breath. You can also take it 15 minutes before exercise or exertional activity. Side effects include heart racing or pounding, jitters or anxiety. If you have a history of an irregular heart rhythm, it can make this worse. Can also give some patients a hard time sleeping.  Follow up with LDCT screening program. Next due for scan June 2025.   Blood work on your way out for allergies causing nasal drainage. I have placed a referral to ENT.

## 2023-11-16 ENCOUNTER — Institutional Professional Consult (permissible substitution) (INDEPENDENT_AMBULATORY_CARE_PROVIDER_SITE_OTHER): Payer: Medicare Other | Admitting: Otolaryngology

## 2024-01-01 DIAGNOSIS — F17210 Nicotine dependence, cigarettes, uncomplicated: Secondary | ICD-10-CM | POA: Diagnosis not present

## 2024-01-01 DIAGNOSIS — R630 Anorexia: Secondary | ICD-10-CM | POA: Diagnosis not present

## 2024-01-01 DIAGNOSIS — R7303 Prediabetes: Secondary | ICD-10-CM | POA: Diagnosis not present

## 2024-01-01 DIAGNOSIS — E7800 Pure hypercholesterolemia, unspecified: Secondary | ICD-10-CM | POA: Diagnosis not present

## 2024-01-01 DIAGNOSIS — R252 Cramp and spasm: Secondary | ICD-10-CM | POA: Diagnosis not present

## 2024-01-01 DIAGNOSIS — Z0000 Encounter for general adult medical examination without abnormal findings: Secondary | ICD-10-CM | POA: Diagnosis not present

## 2024-01-01 DIAGNOSIS — E2839 Other primary ovarian failure: Secondary | ICD-10-CM | POA: Diagnosis not present

## 2024-01-03 ENCOUNTER — Other Ambulatory Visit (HOSPITAL_BASED_OUTPATIENT_CLINIC_OR_DEPARTMENT_OTHER): Payer: Self-pay | Admitting: Family Medicine

## 2024-01-03 DIAGNOSIS — E2839 Other primary ovarian failure: Secondary | ICD-10-CM

## 2024-01-15 ENCOUNTER — Ambulatory Visit
Admission: RE | Admit: 2024-01-15 | Discharge: 2024-01-15 | Disposition: A | Discharge status: Home / Self Care | Source: Ambulatory Visit | Attending: Acute Care | Admitting: Acute Care

## 2024-01-15 DIAGNOSIS — Z87891 Personal history of nicotine dependence: Secondary | ICD-10-CM

## 2024-01-15 DIAGNOSIS — Z122 Encounter for screening for malignant neoplasm of respiratory organs: Secondary | ICD-10-CM

## 2024-01-15 DIAGNOSIS — F17210 Nicotine dependence, cigarettes, uncomplicated: Secondary | ICD-10-CM | POA: Diagnosis not present

## 2024-01-15 DIAGNOSIS — Z136 Encounter for screening for cardiovascular disorders: Secondary | ICD-10-CM | POA: Diagnosis not present

## 2024-01-17 ENCOUNTER — Ambulatory Visit: Payer: Self-pay | Admitting: Internal Medicine

## 2024-01-17 NOTE — Telephone Encounter (Addendum)
 FYI Only or Action Required?: FYI only for provider.  Patient is followed in Pulmonology for COPD, last seen on 09/11/2023 by Meade Verdon RAMAN, MD. Called Nurse Triage reporting Shortness of Breath, Cough, chest congestion, Fatigue, and Wheezing. Symptoms began several days ago. Interventions attempted: Rescue inhaler and Maintenance inhaler. Symptoms are: gradually worsening.  Triage Disposition: See HCP Within 4 Hours (Or PCP Triage)  Patient/caregiver understands and will follow disposition?: Unsure - Scheduled for tomorrow with pulm since no availability today, advised PCP or UC in meantime, advised call back if worsening        Copied from CRM [id number]. Topic: Clinical - Red Word Triage >> Jan 17, 2024 12:26 PM Russell PARAS wrote: Red Word that prompted transfer to Nurse Triage:   Symptoms started 2-3 days ago History of COPD, has not had flare up since beginning of this year Severe cough, woke her up last night Difficulty breathing Thickened phlegm, slightly green mucus In the past has been prescribed prednisone  and antibiotic Reason for Disposition  [1] Longstanding difficulty breathing (e.g., CHF, COPD, emphysema) AND [2] WORSE than normal  Answer Assessment - Initial Assessment Questions E2C2 Pulmonary Triage - Initial Assessment Questions Chief Complaint (e.g., cough, sob, wheezing, fever, chills, sweat or additional symptoms) *Go to specific symptom protocol after initial questions. More SOB a little of both exertion and rest, heat and humidity just killing me, get winded walking to the car, trying my damnedest not to go outside Thickened slightly green sputum Severe cough woke her up last night Get tired easy But no chest pain, dizziness, or weakness more than normal Wheezing a little bit comes and goes, especially if prone position Wheeze a lot at night trying to go to bed Pt speaking in full sentences, laughing on phone  How long have symptoms been present? 2-3  days  MEDICINES:   Have you used any OTC meds to help with symptoms? No  Have you used your inhalers/maintenance medication? Yes If yes, What medications? Breztri  it's marvelous Albuterol  inhaler  If inhaler, ask How many puffs and how often? Note: Review instructions on medication in the chart. Albuterol  inhaler every 4 hours but lasting about 2 hours  OXYGEN: Do you wear supplemental oxygen? No  Do you monitor your oxygen levels? No  6. CARDIAC HISTORY: Do you have any history of heart disease? (e.g., heart attack, angina, bypass surgery, angioplasty)      HTN BP been okay, took it this morning little high but had taken right after COPD meds so that usually bumps up BP a little bit  7. LUNG HISTORY: Do you have any history of lung disease?  (e.g., pulmonary embolus, asthma, emphysema)     COPD  9. OTHER SYMPTOMS: Do you have any other symptoms? (e.g., dizziness, runny nose, cough, chest pain, fever)     No fever, but runny nose pretty much all the time   No pulm availability within next 4 hours, scheduled for tomorrow since earliest day pt could be seen with pulm, advised call back if worsening or new symptoms like fever or chest pain, pt refusing UC in meantime, pt did not confirm or deny if she would contact PCP in meantime for earlier appt  Protocols used: Breathing Difficulty-A-AH

## 2024-01-18 ENCOUNTER — Ambulatory Visit: Admitting: Pulmonary Disease

## 2024-01-18 ENCOUNTER — Encounter: Payer: Self-pay | Admitting: Pulmonary Disease

## 2024-01-18 VITALS — BP 147/82 | HR 84 | Ht 61.0 in | Wt 113.0 lb

## 2024-01-18 DIAGNOSIS — F17210 Nicotine dependence, cigarettes, uncomplicated: Secondary | ICD-10-CM

## 2024-01-18 DIAGNOSIS — J441 Chronic obstructive pulmonary disease with (acute) exacerbation: Secondary | ICD-10-CM | POA: Diagnosis not present

## 2024-01-18 MED ORDER — PREDNISONE 10 MG PO TABS: 40.0000 mg | ORAL_TABLET | Freq: Every day | ORAL | 0 refills | Status: DC

## 2024-01-18 MED ORDER — AZITHROMYCIN 250 MG PO TABS: ORAL_TABLET | ORAL | 0 refills | Status: DC

## 2024-01-18 NOTE — Progress Notes (Signed)
 Synopsis: Acute Visit, COPD exacerbation  Subjective:   PATIENT ID: Anne Montes GENDER: female DOB: 1956-01-22, MRN: [id number]   HPI  Chief Complaint  Patient presents with   Acute Visit    Pt states flare up x Saturday    Nalany Steedley Debi is a 68 year old female with COPD who presents with a suspected COPD flare.  She experiences increased coughing and wheezing, with thick mucus described as 'almost like jello'. Hot and humid weather exacerbates her dyspnea when outside. She uses Breztri , which has been effective, but weather conditions have worsened her symptoms.  She is not currently on prednisone , oxymetolazone spray, or oxycodone . She uses Chantix  for smoking cessation, reducing her smoking from a pack and a half to about five cigarettes daily over the past couple of years.  Recent stressors include caring for her 25 year old granddaughter for three and a half months. 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  She averages two COPD flares per year. She switched from Trelegy to Breztri  due to increased coughing, which improved significantly with the change and the use of a spacer.  Past Medical History:  Diagnosis Date   COPD (chronic obstructive pulmonary disease) (HCC)    High cholesterol    Hypertension      Family History  Problem Relation Age of Onset   Lung cancer Mother        died at 29   Cancer Father        oral cancer   Emphysema Maternal Grandmother    Emphysema Paternal Grandmother      Social History   Socioeconomic History   Marital status: Married    Spouse name: Not on file   Number of children: Not on file   Years of education: Not on file   Highest education level: Not on file  Occupational History   Not on file  Tobacco Use   Smoking status: Every Day    Current packs/day: 1.50    Average packs/day: 1.5 packs/day for 53.2 years (79.8 ttl pk-yrs)     Types: Cigarettes    Start date: 10/31/1970    Passive exposure: Current   Smokeless tobacco: Never   Tobacco comments:    down to 5 cig/day-01/04/21  Vaping Use   Vaping status: Never Used  Substance and Sexual Activity   Alcohol use: Yes   Drug use: Yes    Types: Marijuana   Sexual activity: Yes  Other Topics Concern   Not on file  Social History Narrative   Not on file   Social Drivers of Health   Financial Resource Strain: Not on file  Food Insecurity: Not on file  Transportation Needs: Not on file  Physical Activity: Not on file  Stress: Not on file  Social Connections: Not on file  Intimate Partner Violence: Unknown (10/29/2021)   Received from [facility]   HITS    Physically Hurt: Not on file    Insult or Talk Down To: Not on file    Threaten Physical Harm: Not on file    Scream or Curse: Not on file     No Known Allergies   Outpatient Medications Prior to Visit  Medication Sig Dispense Refill   budesonide-glycopyrrolate-formoterol (BREZTRI  AEROSPHERE) 160-9-4.8 MCG/ACT AERO inhaler Inhale 2 puffs into the lungs 2 (two) times daily.     losartan (COZAAR) 25 MG tablet TAKE 1 TABLET BY MOUTH DAILY; Duration: 90  albuterol  (PROVENTIL ) (2.5 MG/3ML) 0.083% nebulizer solution Take 3 mLs (2.5 mg total) by nebulization every 6 (six) hours as needed for wheezing or shortness of breath. 75 mL 12   albuterol  (VENTOLIN  HFA) 108 (90 Base) MCG/ACT inhaler Inhale 2 puffs into the lungs every 6 (six) hours as needed for shortness of breath. 18 g 11   buPROPion (WELLBUTRIN XL) 150 MG 24 hr tablet 1 tablet in the morning     dicyclomine  (BENTYL ) 20 MG tablet Take 1 tablet (20 mg total) by mouth 2 (two) times daily. 20 tablet 0   rosuvastatin (CRESTOR) 5 MG tablet Take 5 mg by mouth daily.     varenicline  (CHANTIX  CONTINUING MONTH PAK) 1 MG tablet Take 1 tablet (1 mg total) by mouth 2 (two) times daily. 60 tablet 5   Fluticasone-Umeclidin-Vilant (TRELEGY ELLIPTA ) 100-62.5-25  MCG/ACT AEPB Inhale 1 puff into the lungs daily at 6 (six) AM. 1 each 5   nicotine (NICODERM CQ - DOSED IN MG/24 HOURS) 14 mg/24hr patch 1 patch to skin (Patient not taking: Reported on 01/18/2024)     nicotine (NICODERM CQ - DOSED IN MG/24 HR) 7 mg/24hr patch 7 mg daily. (Patient not taking: Reported on 01/18/2024)     ondansetron  (ZOFRAN ) 4 MG tablet Take 1 tablet (4 mg total) by mouth every 6 (six) hours. (Patient not taking: Reported on 01/18/2024) 10 tablet 0   ondansetron  (ZOFRAN -ODT) 4 MG disintegrating tablet Take 1 tablet (4 mg total) by mouth every 8 (eight) hours as needed. (Patient not taking: Reported on 01/18/2024) 20 tablet 0   oxyCODONE -acetaminophen  (PERCOCET/ROXICET) 5-325 MG tablet Take 1 tablet by mouth every 6 (six) hours as needed for severe pain. (Patient not taking: Reported on 01/18/2024) 6 tablet 0   oxymetazoline (MUCINEX SINUS-MAX SINUS/ALLRGY) 0.05 % nasal spray Place 1 spray into both nostrils 2 (two) times daily. (Patient not taking: Reported on 01/18/2024)     predniSONE  (DELTASONE ) 20 MG tablet Take 2 tablets (40 mg total) by mouth daily with breakfast. (Patient not taking: Reported on 01/18/2024) 10 tablet 0   No facility-administered medications prior to visit.    Review of Systems  Constitutional:  Negative for chills, fever, malaise/fatigue and weight loss.  HENT:  Negative for congestion, sinus pain and sore throat.   Eyes: Negative.   Respiratory:  Positive for cough, sputum production, shortness of breath and wheezing. Negative for hemoptysis.   Cardiovascular:  Negative for chest pain, palpitations, orthopnea, claudication and leg swelling.  Gastrointestinal:  Negative for abdominal pain, heartburn, nausea and vomiting.  Genitourinary: Negative.   Musculoskeletal:  Negative for joint pain and myalgias.  Skin:  Negative for rash.  Neurological:  Negative for weakness.  Endo/Heme/Allergies: Negative.   Psychiatric/Behavioral: Negative.        Objective:    Vitals:   01/18/24 1446  BP: (!) 147/82  Pulse: 84  SpO2: 96%  Weight: 113 lb (51.3 kg)  Height: 5' 1 (1.549 m)     Physical Exam Constitutional:      General: She is not in acute distress.    Appearance: Normal appearance.   Eyes:     General: No scleral icterus.    Conjunctiva/sclera: Conjunctivae normal.    Cardiovascular:     Rate and Rhythm: Normal rate and regular rhythm.  Pulmonary:     Breath sounds: Wheezing present. No rhonchi or rales.   Musculoskeletal:     Right lower leg: No edema.     Left lower leg: No edema.  Skin:    General: Skin is warm and dry.   Neurological:     General: No focal deficit present.       CBC    Component Value Date/Time   WBC 13.3 (H) 07/12/2023 1718   RBC 3.89 07/12/2023 1718   HGB 12.7 07/12/2023 1718   HCT 37.7 07/12/2023 1718   PLT 293 07/12/2023 1718   MCV 96.9 07/12/2023 1718   MCH 32.6 07/12/2023 1718   MCHC 33.7 07/12/2023 1718   RDW 12.5 07/12/2023 1718     Chest imaging:  PFT:    Latest Ref Rng & Units 12/22/2020    2:38 PM  PFT Results  FVC-Pre L 2.62   FVC-Predicted Pre % 98   FVC-Post L 2.80   FVC-Predicted Post % 105   Pre FEV1/FVC % % 55   Post FEV1/FCV % % 55   FEV1-Pre L 1.44   FEV1-Predicted Pre % 68   FEV1-Post L 1.55   DLCO uncorrected ml/min/mmHg 14.33   DLCO UNC% % 75   DLCO corrected ml/min/mmHg 14.33   DLCO COR %Predicted % 75   DLVA Predicted % 73   TLC L 5.79   TLC % Predicted % 129   RV % Predicted % 181     Labs:  Path:  Echo:  Heart Catheterization:       Assessment & Plan:   COPD with acute exacerbation (HCC) - Plan: predniSONE  (DELTASONE ) 10 MG tablet, azithromycin  (ZITHROMAX ) 250 MG tablet  Moderate cigarette smoker (10-19 per day)  Discussion:  COPD with acute exacerbation Acute exacerbation likely due to weather, with increased coughing, thick mucus, and wheezing. Possible asthma component considered. - Prescribe prednisone  40 mg daily for 5  days. - Prescribe azithromycin  (Z-Pak). - Continue Breztri . - Ensure albuterol  inhaler and nebulizer availability.  Tobacco use disorder Discussed smoking cessation options for 3 minutes. - Recommend mini nicotine lozenges (2 mg). - Encourage continued Chantix  use.  Follow up with Dr. Meade as scheduled.  Dorn Chill, MD [facility] Office: [phone]   Current Outpatient Medications:    azithromycin  (ZITHROMAX ) 250 MG tablet, Take as directed, Disp: 6 tablet, Rfl: 0   budesonide-glycopyrrolate-formoterol (BREZTRI  AEROSPHERE) 160-9-4.8 MCG/ACT AERO inhaler, Inhale 2 puffs into the lungs 2 (two) times daily., Disp: , Rfl:    losartan (COZAAR) 25 MG tablet, TAKE 1 TABLET BY MOUTH DAILY; Duration: 90, Disp: , Rfl:    predniSONE  (DELTASONE ) 10 MG tablet, Take 4 tablets (40 mg total) by mouth daily with breakfast., Disp: 20 tablet, Rfl: 0   albuterol  (PROVENTIL ) (2.5 MG/3ML) 0.083% nebulizer solution, Take 3 mLs (2.5 mg total) by nebulization every 6 (six) hours as needed for wheezing or shortness of breath., Disp: 75 mL, Rfl: 12   albuterol  (VENTOLIN  HFA) 108 (90 Base) MCG/ACT inhaler, Inhale 2 puffs into the lungs every 6 (six) hours as needed for shortness of breath., Disp: 18 g, Rfl: 11   buPROPion (WELLBUTRIN XL) 150 MG 24 hr tablet, 1 tablet in the morning, Disp: , Rfl:    dicyclomine  (BENTYL ) 20 MG tablet, Take 1 tablet (20 mg total) by mouth 2 (two) times daily., Disp: 20 tablet, Rfl: 0   rosuvastatin (CRESTOR) 5 MG tablet, Take 5 mg by mouth daily., Disp: , Rfl:    varenicline  (CHANTIX  CONTINUING MONTH PAK) 1 MG tablet, Take 1 tablet (1 mg total) by mouth 2 (two) times daily., Disp: 60 tablet, Rfl: 5

## 2024-01-18 NOTE — Patient Instructions (Signed)
 Take 40mg  daily x 5 days  Start Zpak antibiotic  Continue breztri  inhaler 2 puffs twice daily - rinse mouth out after each use  Use albuterol  inhaler 1-2 puffs every 4-6 hours as needed  Continue chantix  1mg  twice daily  Consider using mini nicotine lozenges 2mg  as needed for cigarettes cravings  Follow up as scheduled with Dr. Meade as scheduled

## 2024-01-19 ENCOUNTER — Encounter: Payer: Self-pay | Admitting: Pulmonary Disease

## 2024-01-25 ENCOUNTER — Other Ambulatory Visit: Payer: Self-pay | Admitting: Acute Care

## 2024-01-25 DIAGNOSIS — Z122 Encounter for screening for malignant neoplasm of respiratory organs: Secondary | ICD-10-CM

## 2024-01-25 DIAGNOSIS — F17210 Nicotine dependence, cigarettes, uncomplicated: Secondary | ICD-10-CM

## 2024-01-25 DIAGNOSIS — Z87891 Personal history of nicotine dependence: Secondary | ICD-10-CM

## 2024-01-29 DIAGNOSIS — J449 Chronic obstructive pulmonary disease, unspecified: Secondary | ICD-10-CM | POA: Diagnosis not present

## 2024-01-29 DIAGNOSIS — S92505A Nondisplaced unspecified fracture of left lesser toe(s), initial encounter for closed fracture: Secondary | ICD-10-CM | POA: Diagnosis not present

## 2024-02-09 ENCOUNTER — Other Ambulatory Visit (HOSPITAL_COMMUNITY): Payer: Self-pay | Admitting: Orthopaedic Surgery

## 2024-02-09 ENCOUNTER — Ambulatory Visit (HOSPITAL_COMMUNITY)
Admission: RE | Admit: 2024-02-09 | Discharge: 2024-02-09 | Disposition: A | Discharge status: Home / Self Care | Source: Ambulatory Visit | Attending: Vascular Surgery | Admitting: Vascular Surgery

## 2024-02-09 DIAGNOSIS — M25474 Effusion, right foot: Secondary | ICD-10-CM | POA: Diagnosis not present

## 2024-02-09 DIAGNOSIS — S92512A Displaced fracture of proximal phalanx of left lesser toe(s), initial encounter for closed fracture: Secondary | ICD-10-CM | POA: Diagnosis not present

## 2024-02-09 DIAGNOSIS — M25472 Effusion, left ankle: Secondary | ICD-10-CM | POA: Insufficient documentation

## 2024-02-09 DIAGNOSIS — M25475 Effusion, left foot: Secondary | ICD-10-CM | POA: Insufficient documentation

## 2024-02-09 DIAGNOSIS — M25471 Effusion, right ankle: Secondary | ICD-10-CM | POA: Diagnosis not present

## 2024-02-26 DIAGNOSIS — K080 Exfoliation of teeth due to systemic causes: Secondary | ICD-10-CM | POA: Diagnosis not present

## 2024-03-06 DIAGNOSIS — E7800 Pure hypercholesterolemia, unspecified: Secondary | ICD-10-CM | POA: Diagnosis not present

## 2024-03-06 DIAGNOSIS — R252 Cramp and spasm: Secondary | ICD-10-CM | POA: Diagnosis not present

## 2024-03-08 DIAGNOSIS — S92512D Displaced fracture of proximal phalanx of left lesser toe(s), subsequent encounter for fracture with routine healing: Secondary | ICD-10-CM | POA: Diagnosis not present

## 2024-04-04 ENCOUNTER — Ambulatory Visit: Admitting: Internal Medicine

## 2024-04-22 DIAGNOSIS — K080 Exfoliation of teeth due to systemic causes: Secondary | ICD-10-CM | POA: Diagnosis not present

## 2024-04-29 DIAGNOSIS — K080 Exfoliation of teeth due to systemic causes: Secondary | ICD-10-CM | POA: Diagnosis not present

## 2024-05-06 ENCOUNTER — Encounter: Payer: Self-pay | Admitting: Internal Medicine

## 2024-05-06 ENCOUNTER — Ambulatory Visit: Admitting: Internal Medicine

## 2024-05-06 VITALS — BP 132/82 | HR 69 | Ht 62.0 in | Wt 114.0 lb

## 2024-05-06 DIAGNOSIS — J310 Chronic rhinitis: Secondary | ICD-10-CM

## 2024-05-06 DIAGNOSIS — J4489 Other specified chronic obstructive pulmonary disease: Secondary | ICD-10-CM | POA: Diagnosis not present

## 2024-05-06 DIAGNOSIS — J439 Emphysema, unspecified: Secondary | ICD-10-CM

## 2024-05-06 DIAGNOSIS — F17200 Nicotine dependence, unspecified, uncomplicated: Secondary | ICD-10-CM

## 2024-05-06 DIAGNOSIS — Z122 Encounter for screening for malignant neoplasm of respiratory organs: Secondary | ICD-10-CM

## 2024-05-06 DIAGNOSIS — F17210 Nicotine dependence, cigarettes, uncomplicated: Secondary | ICD-10-CM | POA: Diagnosis not present

## 2024-05-06 LAB — CBC WITH DIFFERENTIAL/PLATELET
Basophils Absolute: 0 K/uL (ref 0.0–0.1)
Basophils Relative: 0.7 % (ref 0.0–3.0)
Eosinophils Absolute: 0.1 K/uL (ref 0.0–0.7)
Eosinophils Relative: 2.1 % (ref 0.0–5.0)
HCT: 44.3 % (ref 36.0–46.0)
Hemoglobin: 14.7 g/dL (ref 12.0–15.0)
Lymphocytes Relative: 34.9 % (ref 12.0–46.0)
Lymphs Abs: 1.8 K/uL (ref 0.7–4.0)
MCHC: 33.2 g/dL (ref 30.0–36.0)
MCV: 99.8 fl (ref 78.0–100.0)
Monocytes Absolute: 0.4 K/uL (ref 0.1–1.0)
Monocytes Relative: 8.5 % (ref 3.0–12.0)
Neutro Abs: 2.8 K/uL (ref 1.4–7.7)
Neutrophils Relative %: 53.8 % (ref 43.0–77.0)
Platelets: 246 K/uL (ref 150.0–400.0)
RBC: 4.44 Mil/uL (ref 3.87–5.11)
RDW: 13.1 % (ref 11.5–15.5)
WBC: 5.2 K/uL (ref 4.0–10.5)

## 2024-05-06 MED ORDER — ALBUTEROL SULFATE HFA 108 (90 BASE) MCG/ACT IN AERS: 2.0000 | INHALATION_SPRAY | Freq: Four times a day (QID) | RESPIRATORY_TRACT | 11 refills | Status: AC | PRN

## 2024-05-06 MED ORDER — BREZTRI AEROSPHERE 160-9-4.8 MCG/ACT IN AERO: 2.0000 | INHALATION_SPRAY | Freq: Two times a day (BID) | RESPIRATORY_TRACT | Status: AC

## 2024-05-06 MED ORDER — BREZTRI AEROSPHERE 160-9-4.8 MCG/ACT IN AERO: 2.0000 | INHALATION_SPRAY | Freq: Two times a day (BID) | RESPIRATORY_TRACT | 6 refills | Status: DC

## 2024-05-06 NOTE — Progress Notes (Signed)
 Anne Montes    [id number]    01/15/1956  Primary Care Physician:Timberlake, Lamarr RAMAN, MD Date of Appointment: 05/06/2024 Established Patient Visit  Chief complaint:   Chief Complaint  Patient presents with   COPD    Patient states the weather has improved her breathing.     HPI: Anne Montes is a 68 y.o. woman with COPD and tobacco use disorder.   Interval Updates: Here for COPD follow. Doing better on breztri  with spacer. In June had COPD exacerbation seen for sick visit with dr. Kara and treated with steroids and antibiotics.   She had a stressful year and started smoking again. Taking care of granddaughter and MIL. But is not trying to get back on track.   Only one flare this year. Triggers - heat, humidity, stress.   Feels sinus drainage/chronic rhinitis is about the same. Flonase helps. Not taking anti-histamines daily.   Albuterol  use is about twice a day. Mornings are the most difficult time. Late evening she also gets some mucus which makes her wheeze.   I have reviewed the patient's family social and past medical history and updated as appropriate.   Past Medical History:  Diagnosis Date   COPD (chronic obstructive pulmonary disease) (HCC)    High cholesterol    Hypertension     History reviewed. No pertinent surgical history.  Family History  Problem Relation Age of Onset   Lung cancer Mother        died at 44   Cancer Father        oral cancer   Emphysema Maternal Grandmother    Emphysema Paternal Grandmother     Social History   Occupational History   Not on file  Tobacco Use   Smoking status: Every Day    Current packs/day: 1.50    Average packs/day: 1.5 packs/day for 53.5 years (80.3 ttl pk-yrs)    Types: Cigarettes    Start date: 10/31/1970    Passive exposure: Current   Smokeless tobacco: Never   Tobacco comments:    down to 5 cig 05/06/2024.  Vaping Use   Vaping status: Never Used  Substance and Sexual Activity    Alcohol use: Yes   Drug use: Yes    Types: Marijuana   Sexual activity: Yes     Physical Exam: Blood pressure 132/82, pulse 69, height 5' 2 (1.575 m), weight 114 lb (51.7 kg), SpO2 98%.  Gen:    No distress, well appearing Lungs:  ctab no whezees or crackles CV:       RRR no mrg.    Data Reviewed: Imaging: CT Chest July 2025 reviewed - no concerning nodules or masses for lung cancer  PFTs: I have personally reviewed the patient's PFTs obtained 12/22/20 which shows moderate airflow limitation without BD response, hyperinflation and air trapping normal diffusion capacity.   Labs: Lab Results  Component Value Date   NA 139 07/12/2023   K 4.1 07/12/2023   CO2 24 07/12/2023   GLUCOSE 148 (H) 07/12/2023   BUN 21 07/12/2023   CREATININE 1.01 (H) 07/12/2023   CALCIUM 9.1 07/12/2023   GFRNONAA >60 07/12/2023   Lab Results  Component Value Date   WBC 13.3 (H) 07/12/2023   HGB 12.7 07/12/2023   HCT 37.7 07/12/2023   MCV 96.9 07/12/2023   PLT 293 07/12/2023    Immunization status: Immunization History  Administered Date(s) Administered   Influenza,inj,Quad PF,6+ Mos 08/02/2021   PFIZER(Purple Top)SARS-COV-2 Vaccination  11/01/2019, 11/26/2019, 07/12/2020, 12/26/2020   PNEUMOCOCCAL CONJUGATE-20 12/17/2020   Pfizer(Comirnaty)Fall Seasonal Vaccine 12 years and older 04/14/2023   Pneumococcal Polysaccharide-23 01/18/2016   Tdap 01/18/2016    Assessment:  COPD/emphysema, 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   Plan/Recommendations:  Continue Breztri  with spacer. Refilled. Financial assistance paperwork filled out today.  Sending albuterol  refills to your pharmacy.   Continue the good work on quitting smoking and continue chantix  and wellbutrin  Take the albuterol  rescue inhaler or nebulizer every 4 to 6 hours as needed for wheezing or shortness of breath. You can also take it 15  minutes before exercise or exertional activity. Side effects include heart racing or pounding, jitters or anxiety. If you have a history of an irregular heart rhythm, it can make this worse. Can also give some patients a hard time sleeping.  Follow up with LDCT screening program. Next due for scan July 2026.   Blood work on your way out for allergies. May qualify you for additional COPD therapies such as biologics.   Return to Care: Return in about 6 months (around 11/04/2024) for Dr Kara.   Verdon Gore, MD Pulmonary and Critical Care Medicine [facility] Office:[phone]

## 2024-05-06 NOTE — Patient Instructions (Addendum)
 It was a pleasure to see you today!  Please schedule follow up with Dr Kara in 6 months. If the schedule is not open yet, we will contact you with a reminder closer to that time. Please call [phone] if you haven't heard from us  a month before, and always call us  sooner if issues or concerns arise. You can also send us  a message through MyChart, but but aware that this is not to be used for urgent issues and it may take up to 5-7 days to receive a reply. Please be aware that you will likely be able to view your results before I have a chance to respond to them. Please give us  5 business days to respond to any non-urgent results.   Continue Breztri  with spacer. Refilled. Financial assistance paperwork filled out today.  Sending albuterol  refills to your pharmacy.   Continue the good work on quitting smoking and continue chantix  and wellbutrin  Take the albuterol  rescue inhaler or nebulizer every 4 to 6 hours as needed for wheezing or shortness of breath. You can also take it 15 minutes before exercise or exertional activity. Side effects include heart racing or pounding, jitters or anxiety. If you have a history of an irregular heart rhythm, it can make this worse. Can also give some patients a hard time sleeping.  Follow up with LDCT screening program. Next due for scan July 2026.   Blood work on your way out for allergies. May qualify you for additional COPD therapies such as biologics.

## 2024-05-06 NOTE — Addendum Note (Signed)
 Addended by: MOODY RANCHER on: 05/06/2024 11:44 AM   Modules accepted: Orders

## 2024-05-07 ENCOUNTER — Ambulatory Visit: Payer: Self-pay | Admitting: Internal Medicine

## 2024-05-14 ENCOUNTER — Encounter: Payer: Self-pay | Admitting: Internal Medicine

## 2024-05-15 MED ORDER — BREZTRI AEROSPHERE 160-9-4.8 MCG/ACT IN AERO: 2.0000 | INHALATION_SPRAY | Freq: Two times a day (BID) | RESPIRATORY_TRACT | 3 refills | Status: AC

## 2024-07-01 DIAGNOSIS — J441 Chronic obstructive pulmonary disease with (acute) exacerbation: Secondary | ICD-10-CM | POA: Diagnosis not present

## 2024-07-18 ENCOUNTER — Emergency Department (HOSPITAL_BASED_OUTPATIENT_CLINIC_OR_DEPARTMENT_OTHER)
Admission: EM | Admit: 2024-07-18 | Discharge: 2024-07-18 | Disposition: A | Discharge status: Home / Self Care | Attending: Emergency Medicine | Admitting: Emergency Medicine

## 2024-07-18 ENCOUNTER — Emergency Department (HOSPITAL_BASED_OUTPATIENT_CLINIC_OR_DEPARTMENT_OTHER): Admitting: Radiology

## 2024-07-18 ENCOUNTER — Other Ambulatory Visit: Payer: Self-pay

## 2024-07-18 ENCOUNTER — Encounter (HOSPITAL_BASED_OUTPATIENT_CLINIC_OR_DEPARTMENT_OTHER): Payer: Self-pay | Admitting: Emergency Medicine

## 2024-07-18 DIAGNOSIS — R0602 Shortness of breath: Secondary | ICD-10-CM | POA: Diagnosis present

## 2024-07-18 DIAGNOSIS — J441 Chronic obstructive pulmonary disease with (acute) exacerbation: Secondary | ICD-10-CM | POA: Diagnosis not present

## 2024-07-18 LAB — CBC WITH DIFFERENTIAL/PLATELET
Abs Immature Granulocytes: 0.03 K/uL (ref 0.00–0.07)
Basophils Absolute: 0.1 K/uL (ref 0.0–0.1)
Basophils Relative: 1 %
Eosinophils Absolute: 0.2 K/uL (ref 0.0–0.5)
Eosinophils Relative: 2 %
HCT: 41.7 % (ref 36.0–46.0)
Hemoglobin: 14 g/dL (ref 12.0–15.0)
Immature Granulocytes: 0 %
Lymphocytes Relative: 38 %
Lymphs Abs: 3.1 K/uL (ref 0.7–4.0)
MCH: 33.3 pg (ref 26.0–34.0)
MCHC: 33.6 g/dL (ref 30.0–36.0)
MCV: 99.3 fL (ref 80.0–100.0)
Monocytes Absolute: 0.6 K/uL (ref 0.1–1.0)
Monocytes Relative: 7 %
Neutro Abs: 4.4 K/uL (ref 1.7–7.7)
Neutrophils Relative %: 52 %
Platelets: 241 K/uL (ref 150–400)
RBC: 4.2 MIL/uL (ref 3.87–5.11)
RDW: 13.2 % (ref 11.5–15.5)
WBC: 8.4 K/uL (ref 4.0–10.5)
nRBC: 0 % (ref 0.0–0.2)

## 2024-07-18 LAB — BASIC METABOLIC PANEL WITH GFR
Anion gap: 8 (ref 5–15)
BUN: 20 mg/dL (ref 8–23)
CO2: 29 mmol/L (ref 22–32)
Calcium: 9.7 mg/dL (ref 8.9–10.3)
Chloride: 102 mmol/L (ref 98–111)
Creatinine, Ser: 0.96 mg/dL (ref 0.44–1.00)
GFR, Estimated: >60 mL/min
Glucose, Bld: 106 mg/dL — ABNORMAL HIGH (ref 70–99)
Potassium: 4.7 mmol/L (ref 3.5–5.1)
Sodium: 139 mmol/L (ref 135–145)

## 2024-07-18 LAB — RESP PANEL BY RT-PCR (RSV, FLU A&B, COVID)  RVPGX2
Influenza A by PCR: NEGATIVE
Influenza B by PCR: NEGATIVE
Resp Syncytial Virus by PCR: NEGATIVE
SARS Coronavirus 2 by RT PCR: NEGATIVE

## 2024-07-18 MED ORDER — IPRATROPIUM-ALBUTEROL 0.5-2.5 (3) MG/3ML IN SOLN
3.0000 mL | Freq: Once | RESPIRATORY_TRACT | Status: AC
  Administered 2024-07-18: 3 mL via RESPIRATORY_TRACT
  Filled 2024-07-18: qty 3

## 2024-07-18 MED ORDER — METHYLPREDNISOLONE SODIUM SUCC 125 MG IJ SOLR
125.0000 mg | Freq: Once | INTRAMUSCULAR | Status: AC
  Administered 2024-07-18: 125 mg via INTRAVENOUS
  Filled 2024-07-18: qty 2

## 2024-07-18 MED ORDER — PREDNISONE 20 MG PO TABS: ORAL_TABLET | ORAL | 0 refills | Status: AC

## 2024-07-18 NOTE — ED Provider Notes (Signed)
 "  [facility] EMERGENCY DEPARTMENT AT [facility]  Provider Note  CSN: [id number] Arrival date & time: 07/18/24 0301  History Chief Complaint  Patient presents with   Shortness of Breath    Anne Montes is a 68 y.o. female with history of tobacco use and COPD was seen in PCP office about 2 weeks ago for increased SOB/wheezing, given Zpak and Prednisone  x 5 days. She was initially doing better, but breathing has worsened in the last 2 days with thick sputum. No fevers. Has been using nebs and inhalers at home.    Home Medications Prior to Admission medications  Medication Sig Start Date End Date Taking? Authorizing Provider  predniSONE  (DELTASONE ) 20 MG tablet Take 3 tablets (60 mg total) by mouth daily with breakfast for 3 days, THEN 2 tablets (40 mg total) daily with breakfast for 3 days, THEN 1 tablet (20 mg total) daily with breakfast for 3 days, THEN 0.5 tablets (10 mg total) daily with breakfast for 3 days. 07/18/24 07/30/24 Yes Roselyn Carlin NOVAK, MD  albuterol  (PROVENTIL ) (2.5 MG/3ML) 0.083% nebulizer solution Take 3 mLs (2.5 mg total) by nebulization every 6 (six) hours as needed for wheezing or shortness of breath. 09/11/23   Desai, Nikita S, MD  albuterol  (VENTOLIN  HFA) 108 (90 Base) MCG/ACT inhaler Inhale 2 puffs into the lungs every 6 (six) hours as needed for shortness of breath. 05/06/24   Desai, Nikita S, MD  budesonide-glycopyrrolate-formoterol (BREZTRI  AEROSPHERE) 160-9-4.8 MCG/ACT AERO inhaler Inhale 2 puffs into the lungs in the morning and at bedtime. 05/06/24   Desai, Nikita S, MD  budesonide-glycopyrrolate-formoterol (BREZTRI  AEROSPHERE) 160-9-4.8 MCG/ACT AERO inhaler Inhale 2 puffs into the lungs 2 (two) times daily. 05/15/24   Desai, Nikita S, MD  buPROPion (WELLBUTRIN XL) 150 MG 24 hr tablet 1 tablet in the morning 10/05/20   [provider]  losartan (COZAAR) 25 MG tablet TAKE 1 TABLET BY MOUTH DAILY; Duration: 90 06/30/23   [provider]   rosuvastatin (CRESTOR) 5 MG tablet Take 5 mg by mouth daily. 10/13/20   [provider]  varenicline  (CHANTIX  CONTINUING MONTH PAK) 1 MG tablet Take 1 tablet (1 mg total) by mouth 2 (two) times daily. 03/22/23   Meade Verdon RAMAN, MD     Allergies    Patient has no known allergies.   Review of Systems   Review of Systems Please see HPI for pertinent positives and negatives  Physical Exam BP (!) 156/92 (BP Location: Right Arm)   Pulse 72   Temp 98.4 F (36.9 C) (Oral)   Resp 18   Ht 5' 2 (1.575 m)   Wt 51.7 kg   SpO2 96%   BMI 20.85 kg/m   Physical Exam Vitals and nursing note reviewed.  Constitutional:      Appearance: Normal appearance.  HENT:     Head: Normocephalic and atraumatic.     Nose: Nose normal.     Mouth/Throat:     Mouth: Mucous membranes are moist.  Eyes:     Extraocular Movements: Extraocular movements intact.     Conjunctiva/sclera: Conjunctivae normal.  Cardiovascular:     Rate and Rhythm: Normal rate.  Pulmonary:     Effort: Pulmonary effort is normal.     Breath sounds: Wheezing and rhonchi present.  Abdominal:     General: Abdomen is flat.     Palpations: Abdomen is soft.     Tenderness: There is no abdominal tenderness.  Musculoskeletal:  General: No swelling. Normal range of motion.     Cervical back: Neck supple.  Skin:    General: Skin is warm and dry.  Neurological:     General: No focal deficit present.     Mental Status: She is alert.  Psychiatric:        Mood and Affect: Mood normal.     ED Results / Procedures / Treatments   EKG None  Procedures Procedures  Medications Ordered in the ED Medications  ipratropium-albuterol  (DUONEB) 0.5-2.5 (3) MG/3ML nebulizer solution 3 mL (3 mLs Nebulization Given 07/18/24 0329)  methylPREDNISolone  sodium succinate (SOLU-MEDROL ) 125 mg/2 mL injection 125 mg (125 mg Intravenous Given 07/18/24 0320)    Initial Impression and Plan  Patient here with likely COPD  exacerbation, less likely viral URI. Will check labs and CXR. Give nebs, steroids and reassess.   ED Course   Clinical Course as of 07/18/24 0453  Thu Jul 18, 2024  0345 I personally viewed the images from radiology studies and agree with radiologist interpretation: CXR is clear [CS]  0353 CBC is normal.  [CS]  0403 BMP unremarkable.  [CS]  0429 Covid/Flu/RSV swab is neg.  [CS]  0451 Patient reports she is feeling better and comfortable going home. Will give a longer taper of prednisone  given the 5 day burst earlier this month did not help much. She has plenty of albuterol  at home. PCP follow up, RTED for any other concerns.   [CS]    Clinical Course User Index [CS] Roselyn Carlin NOVAK, MD     MDM Rules/Calculators/A&P Medical Decision Making Given presenting complaint, I considered that admission might be necessary. After review of results from ED lab and/or imaging studies, admission to the hospital is not indicated at this time.    Problems Addressed: COPD exacerbation (HCC): acute illness or injury  Amount and/or Complexity of Data Reviewed Labs: ordered. Decision-making details documented in ED Course. Radiology: ordered and independent interpretation performed. Decision-making details documented in ED Course.  Risk Prescription drug management. Decision regarding hospitalization.     Final Clinical Impression(s) / ED Diagnoses Final diagnoses:  COPD exacerbation (HCC)    Rx / DC Orders ED Discharge Orders          Ordered    predniSONE  (DELTASONE ) 20 MG tablet  Q breakfast        07/18/24 0453             Roselyn Carlin NOVAK, MD 07/18/24 [phone]  "

## 2024-07-18 NOTE — ED Triage Notes (Addendum)
 Pt presents with SOB x 1 day, reports recent illness and was on prednisone  with no relief, has used home inhalers and nebs with no relief, hx of COPD, 94% RA, EDP at bedside during triage

## 2024-07-18 NOTE — ED Notes (Signed)
 Patient transported to X-ray

## 2024-09-02 ENCOUNTER — Ambulatory Visit: Admitting: Pulmonary Disease
